# Patient Record
Sex: Female | Born: 1985
Health system: Southern US, Community
[De-identification: ages and names within clinical notes are randomized; demographics above are authoritative.]

## PROBLEM LIST (undated history)

## (undated) DIAGNOSIS — F419 Anxiety disorder, unspecified: Secondary | ICD-10-CM

## (undated) DIAGNOSIS — N809 Endometriosis, unspecified: Secondary | ICD-10-CM

## (undated) DIAGNOSIS — G43909 Migraine, unspecified, not intractable, without status migrainosus: Secondary | ICD-10-CM

## (undated) DIAGNOSIS — L52 Erythema nodosum: Secondary | ICD-10-CM

## (undated) DIAGNOSIS — E669 Obesity, unspecified: Secondary | ICD-10-CM

## (undated) DIAGNOSIS — Z872 Personal history of diseases of the skin and subcutaneous tissue: Secondary | ICD-10-CM

## (undated) DIAGNOSIS — L700 Acne vulgaris: Secondary | ICD-10-CM

## (undated) DIAGNOSIS — I10 Essential (primary) hypertension: Secondary | ICD-10-CM

## (undated) DIAGNOSIS — J309 Allergic rhinitis, unspecified: Secondary | ICD-10-CM

## (undated) DIAGNOSIS — Z8489 Family history of other specified conditions: Secondary | ICD-10-CM

## (undated) HISTORY — DX: Endometriosis, unspecified: N80.9

## (undated) HISTORY — DX: Anxiety disorder, unspecified: F41.9

## (undated) HISTORY — DX: Obesity, unspecified: E66.9

## (undated) HISTORY — DX: Erythema nodosum: L52

## (undated) HISTORY — DX: Acne vulgaris: L70.0

## (undated) HISTORY — DX: Migraine, unspecified, not intractable, without status migrainosus: G43.909

## (undated) HISTORY — PX: WISDOM TOOTH EXTRACTION: SHX21

## (undated) HISTORY — DX: Essential (primary) hypertension: I10

## (undated) HISTORY — DX: Personal history of diseases of the skin and subcutaneous tissue: Z87.2

## (undated) HISTORY — DX: Allergic rhinitis, unspecified: J30.9

---

## 2004-01-25 ENCOUNTER — Other Ambulatory Visit: Admission: RE | Admit: 2004-01-25 | Discharge: 2004-01-25 | Payer: Self-pay | Admitting: *Deleted

## 2005-03-18 ENCOUNTER — Other Ambulatory Visit: Admission: RE | Admit: 2005-03-18 | Discharge: 2005-03-18 | Payer: Self-pay | Admitting: *Deleted

## 2006-03-17 ENCOUNTER — Other Ambulatory Visit: Admission: RE | Admit: 2006-03-17 | Discharge: 2006-03-17 | Payer: Self-pay | Admitting: *Deleted

## 2007-05-26 ENCOUNTER — Other Ambulatory Visit: Admission: RE | Admit: 2007-05-26 | Discharge: 2007-05-26 | Payer: Self-pay | Admitting: *Deleted

## 2011-08-01 ENCOUNTER — Ambulatory Visit (INDEPENDENT_AMBULATORY_CARE_PROVIDER_SITE_OTHER): Payer: BC Managed Care – PPO | Admitting: Physician Assistant

## 2011-08-01 DIAGNOSIS — M545 Low back pain, unspecified: Secondary | ICD-10-CM

## 2011-08-13 ENCOUNTER — Ambulatory Visit (INDEPENDENT_AMBULATORY_CARE_PROVIDER_SITE_OTHER): Payer: BC Managed Care – PPO

## 2011-08-13 DIAGNOSIS — M545 Low back pain, unspecified: Secondary | ICD-10-CM

## 2011-08-13 DIAGNOSIS — L0501 Pilonidal cyst with abscess: Secondary | ICD-10-CM

## 2011-08-15 ENCOUNTER — Ambulatory Visit (INDEPENDENT_AMBULATORY_CARE_PROVIDER_SITE_OTHER): Payer: BC Managed Care – PPO | Admitting: Physician Assistant

## 2011-08-15 DIAGNOSIS — L0591 Pilonidal cyst without abscess: Secondary | ICD-10-CM

## 2011-08-17 ENCOUNTER — Ambulatory Visit (INDEPENDENT_AMBULATORY_CARE_PROVIDER_SITE_OTHER): Payer: BC Managed Care – PPO

## 2011-08-17 DIAGNOSIS — L0501 Pilonidal cyst with abscess: Secondary | ICD-10-CM

## 2011-08-19 ENCOUNTER — Encounter: Payer: Self-pay | Admitting: Physician Assistant

## 2011-08-19 ENCOUNTER — Ambulatory Visit (INDEPENDENT_AMBULATORY_CARE_PROVIDER_SITE_OTHER): Payer: BC Managed Care – PPO

## 2011-08-19 DIAGNOSIS — I10 Essential (primary) hypertension: Secondary | ICD-10-CM | POA: Insufficient documentation

## 2011-08-19 DIAGNOSIS — L0501 Pilonidal cyst with abscess: Secondary | ICD-10-CM

## 2011-08-21 ENCOUNTER — Ambulatory Visit (INDEPENDENT_AMBULATORY_CARE_PROVIDER_SITE_OTHER): Payer: BC Managed Care – PPO | Admitting: Physician Assistant

## 2011-08-21 VITALS — BP 130/90 | HR 84 | Temp 98.4°F | Resp 20 | Ht 69.0 in | Wt 259.0 lb

## 2011-08-21 DIAGNOSIS — L0591 Pilonidal cyst without abscess: Secondary | ICD-10-CM

## 2011-08-21 NOTE — Patient Instructions (Signed)
Continue doxycycline.  Continue warm compresses.

## 2011-08-21 NOTE — Progress Notes (Signed)
  Subjective:    Patient ID: Kimberly Glass, female    DOB: March 17, 1986, 26 y.o.   MRN: [id number]  HPI  26 yo WF presents for wound care after I&D of Infected pilonidal cyst on 08/13/2011.  Tolerating Doxycycline well.  Decreasing pain and drainage as expected.  Review of Systems Decreasing pain at site.      Objective:   Physical Exam  VS noted.  Dressing and packing removed.  Small amount of purulence expressed.  No induration.  No erythema.  Minimal tenderness.  Irrigated wound with 2 cc 2% lidocaine plain.  Re-packed with 1/4 inch plain packing and dressed.      Assessment & Plan:  Infected Pilonidal Cyst, s/p I&D 08/13/2011.  Complete Doxycycline.  Continue dressing changes daily and prn.  Return for wound care in 48 hours.

## 2011-08-23 ENCOUNTER — Ambulatory Visit (INDEPENDENT_AMBULATORY_CARE_PROVIDER_SITE_OTHER): Payer: BC Managed Care – PPO | Admitting: Physician Assistant

## 2011-08-23 VITALS — BP 116/76 | HR 102 | Temp 99.1°F | Resp 16 | Ht 69.0 in | Wt 262.0 lb

## 2011-08-23 DIAGNOSIS — L0591 Pilonidal cyst without abscess: Secondary | ICD-10-CM

## 2011-08-23 DIAGNOSIS — L0501 Pilonidal cyst with abscess: Secondary | ICD-10-CM

## 2011-08-23 NOTE — Progress Notes (Signed)
  Subjective:    Patient ID: Kimberly Glass, female    DOB: 1985/12/06, 26 y.o.   MRN: [id number]  HPI Patient presents for wound care status post I and D. Of an infected pilonidal cyst on 08/13/2011. She continues to do well and will take her last dose of doxycycline today. She notes itching at the area.   Review of Systems Itching. Appropriate drainage. Minimal discomfort.    Objective:   Physical Exam Dressing and packing removed. Appropriate bloody discharge. No pus. Nontender. Wound cavity irrigated with 2 cc of 2% plain lidocaine. Gently packed with 1/4 inch plain packing. Dressed.       Assessment & Plan:  Pilonidal cyst with abscess, status post I&D on 08/13/2011.  Continue wound care. Recheck with me in 48 hours.

## 2011-08-25 ENCOUNTER — Ambulatory Visit (INDEPENDENT_AMBULATORY_CARE_PROVIDER_SITE_OTHER): Payer: BC Managed Care – PPO | Admitting: Physician Assistant

## 2011-08-25 VITALS — BP 123/82 | HR 79 | Temp 98.9°F | Resp 16 | Ht 69.0 in | Wt 259.0 lb

## 2011-08-25 DIAGNOSIS — L0591 Pilonidal cyst without abscess: Secondary | ICD-10-CM

## 2011-08-25 NOTE — Progress Notes (Signed)
  Subjective:    Patient ID: Kimberly Glass, female    DOB: 14-Jul-1986, 26 y.o.   MRN: [id number]  Abscess   Patient presents for wound care status post I and D. Of an infected pilonidal cyst on 08/13/2011. She has completed doxycycline. She notes itching at the area.   Review of Systems  Itching. Appropriate drainage. Minimal discomfort.    Objective:   Physical Exam  Dressing and packing removed. Appropriate bloody discharge. No pus. Nontender. Wound cavity irrigated with 2 cc of 2% plain lidocaine. Gently packed with 1/4 inch plain packing. Dressed.       Assessment & Plan:  Pilonidal cyst with abscess, status post I&D on 08/13/2011.  Continue wound care. Recheck with Eula Listen, PA-C in 48 hours.

## 2011-08-27 ENCOUNTER — Encounter: Payer: Self-pay | Admitting: Physician Assistant

## 2011-08-27 ENCOUNTER — Ambulatory Visit (INDEPENDENT_AMBULATORY_CARE_PROVIDER_SITE_OTHER): Payer: BC Managed Care – PPO | Admitting: Physician Assistant

## 2011-08-27 VITALS — BP 129/80 | HR 88 | Temp 98.5°F | Resp 16 | Ht 69.0 in | Wt 262.4 lb

## 2011-08-27 DIAGNOSIS — L0501 Pilonidal cyst with abscess: Secondary | ICD-10-CM

## 2011-08-27 NOTE — Progress Notes (Signed)
   Patient ID: Kimberly Glass MRN: [id number], DOB: October 24, 1985 26 y.o. Date of Encounter: 08/27/2011, 6:49 PM  Primary Physician: No primary provider on file.  Chief Complaint: Wound care   See previous note  HPI: 26 y.o. y/o Caucasian female presents for wound care of pilonidal abscess s/p I&D on 08/15/2011.  Doing well No issues or complaints Afebrile/ no chills No nausea or vomiting Finished Doxycycline No pain Previous notes reviewed  Past Medical History  Diagnosis Date  . Obesity, unspecified   . Acne vulgaris   . Allergic rhinitis, cause unspecified   . Erythema nodosum     associated with menses; improved with OCP  . Essential hypertension, benign   . Anxiety      Home Meds: Prior to Admission medications   Medication Sig Start Date End Date Taking? Authorizing Provider  methyldopa (ALDOMET) 500 MG tablet Take 500 mg by mouth 2 (two) times daily.    Historical Provider, MD    Allergies:  Allergies  Allergen Reactions  . Diphenhydramine Other (See Comments)    Hyperactivity  . Yasmin (Drospirenone-Ethinyl Estradiol) Other (See Comments)    Migraine Headache  . Penicillins Hives    ROS: Constitutional: Afebrile, no chills Cardiovascular: negative for chest pain or palpitations Dermatological: Positive for wound. Negative for erythema, pain, or warmth  GI: No nausea or vomiting   EXAM: Physical Exam: Blood pressure 129/80, pulse 88, temperature 98.5 F (36.9 C), temperature source Oral, resp. rate 16, height 5\' 9"  (1.753 m), weight 262 lb 6.4 oz (119.024 kg), last menstrual period 08/07/2011. General: Well developed, well nourished, in no acute distress. Nontoxic appearing. Head: Normocephalic, atraumatic, sclera non-icteric.  Neck: Supple. Lungs: Breathing is unlabored. Heart: Normal rate Skin:  Warm and moist. Dressing and packing in place. No induration, erythema, tenderness to palpation. Neuro: Alert and oriented X 3. Moves all extremities  spontaneously. Normal gait.  Psych:  Responds to questions appropriately with a normal affect.   PROCEDURE: Dressing and packing removed. Small amount of bloody purulence expressed Wound bed healthy Irrigated with 1% plain lidocaine 5 cc. Repacked with 1/4 plain packing Dressing applied  LAB: Culture: No predominant organism  A/P: 26 y.o. y/o Caucasian female with pilonidal abscess as above s/p I&D on 08/15/11 -wound care per above -Finished Doxycycline -No pain -Daily dressing changes -Recheck 48 hours  Signed, Eula Listen, PA-C 08/27/2011

## 2011-08-27 NOTE — Patient Instructions (Signed)
Pilonidal Cyst A pilonidal cyst occurs when hairs get trapped (ingrown) beneath the skin in the crease between the buttocks over your sacrum (the bone under that crease). Pilonidal cysts are most common in young men with a lot of body hair. When the cyst is ruptured (breaks) or leaking, fluid from the cyst may cause burning and itching. If the cyst becomes infected, it causes a painful swelling filled with pus (abscess). The pus and trapped hairs need to be removed (often by lancing) so that the infection can heal. However, recurrence is common and an operation may be needed to remove the cyst. HOME CARE INSTRUCTIONS   If the cyst was NOT INFECTED:   Keep the area clean and dry. Bathe or shower daily. Wash the area well with a germ-killing soap. Warm tub baths may help prevent infection and help with drainage. Dry the area well with a towel.   Avoid tight clothing to keep area as moisture free as possible.   Keep area between buttocks as free of hair as possible. A depilatory may be used.   If the cyst WAS INFECTED and needed to be drained:   Your caregiver packed the wound with gauze to keep the wound open. This allows the wound to heal from the inside outwards and continue draining.   Return for a wound check in 1 day or as suggested.   If you take tub baths or showers, repack the wound with gauze following them. Sponge baths (at the sink) are a good alternative.   If an antibiotic was ordered to fight the infection, take as directed.   Only take over-the-counter or prescription medicines for pain, discomfort, or fever as directed by your caregiver.   After the drain is removed, use sitz baths for 20 minutes 4 times per day. Clean the wound gently with mild unscented soap, pat dry, and then apply a dry dressing.  SEEK MEDICAL CARE IF:   You have increased pain, swelling, redness, drainage, or bleeding from the area.   You have a fever.   You have muscles aches, dizziness, or a  general ill feeling.  Document Released: 07/05/2000 Document Revised: 03/20/2011 Document Reviewed: 09/02/2008 [employer] Patient Information 2012 [employer]. 

## 2011-08-29 ENCOUNTER — Ambulatory Visit (INDEPENDENT_AMBULATORY_CARE_PROVIDER_SITE_OTHER): Payer: BC Managed Care – PPO | Admitting: Physician Assistant

## 2011-08-29 ENCOUNTER — Encounter: Payer: Self-pay | Admitting: Physician Assistant

## 2011-08-29 VITALS — BP 131/85 | HR 90 | Temp 98.4°F | Resp 16 | Ht 69.0 in | Wt 261.0 lb

## 2011-08-29 DIAGNOSIS — L0501 Pilonidal cyst with abscess: Secondary | ICD-10-CM

## 2011-08-29 NOTE — Progress Notes (Signed)
   Patient ID: Kimberly Glass MRN: [id number], DOB: [birth year] 26 y.o. Date of Encounter: 08/29/2011, 1:38 PM  Primary Physician: No primary provider on file.  Chief Complaint: Wound care   See previous note  HPI: 26 y.o. y/o Caucasian female presents for wound care of pilonidal abscess s/p I&D on 08/15/11. Doing well No issues or complaints Afebrile/ no chills No nausea or vomiting Finished Doxycycline No pain Previous notes reviewed  Past Medical History  Diagnosis Date  . Obesity, unspecified   . Acne vulgaris   . Allergic rhinitis, cause unspecified   . Erythema nodosum     associated with menses; improved with OCP  . Essential hypertension, benign   . Anxiety      Home Meds: Prior to Admission medications   Medication Sig Start Date End Date Taking? Authorizing Provider  methyldopa (ALDOMET) 500 MG tablet Take 500 mg by mouth 2 (two) times daily.   Yes Historical Provider, MD    Allergies:  Allergies  Allergen Reactions  . Diphenhydramine Other (See Comments)    Hyperactivity  . Yasmin (Drospirenone-Ethinyl Estradiol) Other (See Comments)    Migraine Headache  . Penicillins Hives    ROS: Constitutional: Afebrile, no chills Cardiovascular: negative for chest pain or palpitations Dermatological: Positive for wound. Negative for erythema, pain, or warmth  GI: No nausea or vomiting   EXAM: Physical Exam: Blood pressure 131/85, pulse 90, temperature 98.4 F (36.9 C), temperature source Oral, resp. rate 16, height 5\' 9"  (1.753 m), weight 261 lb (118.389 kg), last menstrual period 08/07/2011. General: Well developed, well nourished, in no acute distress. Nontoxic appearing. Head: Normocephalic, atraumatic, sclera non-icteric.  Neck: Supple. Lungs: Breathing is unlabored. Heart: Normal rate Skin:  Warm and moist. Dressing and packing in place. No induration, erythema, tenderness to palpation. Neuro: Alert and oriented X 3. Moves all extremities  spontaneously. Normal gait.  Psych:  Responds to questions appropriately with a normal affect.   PROCEDURE: Dressing and packing removed. No purulence expressed Wound bed healthy Irrigated with 1% plain lidocaine 5 cc. Repacked with 1/4 plain packing Dressing applied  LAB: Culture: No predominant organism  A/P: 26 y.o. y/o Caucasian female with improving pilonidal abscess as above s/p I&D on 08/15/11 -wound care per above -Finished Doxycycline -No pain -Daily dressing changes -Recheck 48 hours  Elinor Dodge, PA-C 08/29/2011 1:43 PM

## 2011-08-31 ENCOUNTER — Ambulatory Visit (INDEPENDENT_AMBULATORY_CARE_PROVIDER_SITE_OTHER): Payer: BC Managed Care – PPO | Admitting: Physician Assistant

## 2011-08-31 VITALS — BP 124/86 | HR 82 | Temp 98.2°F | Resp 16 | Ht 69.0 in | Wt 261.0 lb

## 2011-08-31 DIAGNOSIS — L0501 Pilonidal cyst with abscess: Secondary | ICD-10-CM

## 2011-08-31 DIAGNOSIS — L0591 Pilonidal cyst without abscess: Secondary | ICD-10-CM

## 2011-08-31 NOTE — Progress Notes (Signed)
   Patient ID: Kimberly Glass MRN: [id number], DOB: 01-23-1986 26 y.o. Date of Encounter: 08/31/2011, 5:23 PM  Primary Physician: No primary provider on file.  Chief Complaint: Wound care   See previous note  HPI: 26 y.o. y/o Caucasian female presents for wound care of pilonidal abscess s/p I&D on 08/15/11. Doing well No issues or complaints Afebrile/ no chills No nausea or vomiting Finished Doxycycline No pain Previous note reviewed  O/w doing well with no issue or complaints.  Past Medical History  Diagnosis Date  . Obesity, unspecified   . Acne vulgaris   . Allergic rhinitis, cause unspecified   . Erythema nodosum     associated with menses; improved with OCP  . Essential hypertension, benign   . Anxiety      Home Meds: Prior to Admission medications   Medication Sig Start Date End Date Taking? Authorizing Provider  methyldopa (ALDOMET) 500 MG tablet Take 500 mg by mouth 2 (two) times daily.   Yes Historical Provider, MD    Allergies:  Allergies  Allergen Reactions  . Diphenhydramine Other (See Comments)    Hyperactivity  . Yasmin (Drospirenone-Ethinyl Estradiol) Other (See Comments)    Migraine Headache  . Penicillins Hives    ROS: Constitutional: Afebrile, no chills Cardiovascular: negative for chest pain or palpitations Dermatological: Positive for wound. Negative for erythema, pain, or warmth  GI: No nausea or vomiting   EXAM: Physical Exam: Blood pressure 124/86, pulse 82, temperature 98.2 F (36.8 C), temperature source Oral, resp. rate 16, height 5\' 9"  (1.753 m), weight 261 lb (118.389 kg), last menstrual period 08/07/2011. General: Well developed, well nourished, in no acute distress. Nontoxic appearing. Head: Normocephalic, atraumatic, sclera non-icteric.  Neck: Supple. Lungs: Breathing is unlabored. Heart: Normal rate. Skin:  Warm and moist. Dressing and packing in place. No induration, erythema, tenderness to palpation. Neuro: Alert and  oriented X 3. Moves all extremities spontaneously. Normal gait.  Psych:  Responds to questions appropriately with a normal affect.   PROCEDURE: Dressing and packing removed. No purulence expressed Wound bed healthy Irrigated with 1% plain lidocaine 5 cc. Repacked with 1/4 plain packing Dressing applied  LAB: Culture: No predominant organism  A/P: 26 y.o. y/o Caucasian female with improving pilonidal abscess as above s/p I&D on 08/15/11. -wound care per above -Finished Doxycycline -No pain -Daily dressing changes -Recheck 72 hours  Elinor Dodge, PA-C 08/31/2011 5:23 PM

## 2011-09-03 ENCOUNTER — Ambulatory Visit (INDEPENDENT_AMBULATORY_CARE_PROVIDER_SITE_OTHER): Payer: BC Managed Care – PPO | Admitting: Physician Assistant

## 2011-09-03 VITALS — BP 139/90 | HR 87 | Temp 98.8°F | Resp 16 | Ht 69.0 in | Wt 259.2 lb

## 2011-09-03 DIAGNOSIS — L0501 Pilonidal cyst with abscess: Secondary | ICD-10-CM

## 2011-09-03 MED ORDER — DOXYCYCLINE HYCLATE 50 MG PO CAPS: 100.0000 mg | ORAL_CAPSULE | Freq: Two times a day (BID) | ORAL | Status: AC

## 2011-09-03 NOTE — Progress Notes (Signed)
   Patient ID: Kimberly Glass MRN: [id number], DOB: Apr 25, 1986 26 y.o. Date of Encounter: 09/03/2011, 6:20 PM  Primary Physician: No primary provider on file.  Chief Complaint: Wound care   See previous note  HPI: 26 y.o. y/o Caucasian female presents for wound care s/p I&D on 08/15/11. Doing well No issues or complaints Afebrile/ no chills No nausea or vomiting Finished Doxycycline No pain Previous note reviewed  Past Medical History  Diagnosis Date  . Obesity, unspecified   . Acne vulgaris   . Allergic rhinitis, cause unspecified   . Erythema nodosum     associated with menses; improved with OCP  . Essential hypertension, benign   . Anxiety      Home Meds: Prior to Admission medications   Medication Sig Start Date End Date Taking? Authorizing Provider  methyldopa (ALDOMET) 500 MG tablet Take 500 mg by mouth 2 (two) times daily.   Yes Historical Provider, MD    Allergies:  Allergies  Allergen Reactions  . Diphenhydramine Other (See Comments)    Hyperactivity  . Yasmin (Drospirenone-Ethinyl Estradiol) Other (See Comments)    Migraine Headache  . Penicillins Hives    ROS: Constitutional: Afebrile, no chills Cardiovascular: negative for chest pain or palpitations Dermatological: Positive for wound. Negative for erythema, pain, or warmth  GI: No nausea or vomiting   EXAM: Physical Exam: Blood pressure 139/90, pulse 87, temperature 98.8 F (37.1 C), temperature source Oral, resp. rate 16, height 5\' 9"  (1.753 m), weight 259 lb 3.2 oz (117.572 kg), last menstrual period 08/07/2011., Body mass index is 38.28 kg/(m^2). General: Well developed, well nourished, in no acute distress. Nontoxic appearing. Head: Normocephalic, atraumatic, sclera non-icteric.  Neck: Supple. Lungs: Breathing is unlabored. Heart: Normal rate. Skin:  Warm and moist. Dressing and packing in place. No induration, erythema, tenderness to palpation. Does have an isolated pustule at  approximately 11 o'clock from the initial lesion. No induration or fluctuance.   Neuro: Alert and oriented X 3. Moves all extremities spontaneously. Normal gait.  Psych:  Responds to questions appropriately with a normal affect.   PROCEDURE: Dressing and packing removed. Scant purulence expressed Wound bed healthy Irrigated with 1% plain lidocaine 5 cc Repacked with 1/4 plain packing Dressing applied  LAB: Culture: No predominant organism  A/P: 26 y.o. y/o Caucasian female with improving cellulitis/abscess as above s/p I&D on 08/15/11. -wound care per above -Restart Doxycycline 100 mg #20 1 po bid no RF, secondary to new pustule  -Pain well controlled -Daily dressing changes -Advance packing in 48 hours -Recheck 48 hours later  Signed, Eula Listen, PA-C 09/03/2011 6:20 PM

## 2011-09-07 ENCOUNTER — Ambulatory Visit (INDEPENDENT_AMBULATORY_CARE_PROVIDER_SITE_OTHER): Payer: BC Managed Care – PPO | Admitting: Physician Assistant

## 2011-09-07 VITALS — BP 118/74 | HR 76 | Temp 98.6°F | Resp 16 | Ht 69.0 in | Wt 261.0 lb

## 2011-09-07 DIAGNOSIS — L0501 Pilonidal cyst with abscess: Secondary | ICD-10-CM

## 2011-09-07 NOTE — Progress Notes (Signed)
   Patient ID: Kimberly Glass MRN: [id number], DOB: Dec 02, 1985 26 y.o. Date of Encounter: 09/07/2011, 3:31 PM  Primary Physician: No primary provider on file.  Chief Complaint: Wound care   See previous note  HPI: 26 y.o. y/o female presents for wound care s/p I&D on 08/15/11. Doing well No issues or complaints Afebrile/ no chills No nausea or vomiting Tolerating Doxycycline No pain Previous note reviewed  Past Medical History  Diagnosis Date  . Obesity, unspecified   . Acne vulgaris   . Allergic rhinitis, cause unspecified   . Erythema nodosum     associated with menses; improved with OCP  . Essential hypertension, benign   . Anxiety      Home Meds: Prior to Admission medications   Medication Sig Start Date End Date Taking? Authorizing Provider  doxycycline (VIBRAMYCIN) 50 MG capsule Take 2 capsules (100 mg total) by mouth 2 (two) times daily. 09/03/11 09/13/11  Yakima Kreitzer, PA-C  methyldopa (ALDOMET) 500 MG tablet Take 500 mg by mouth 2 (two) times daily.    Historical Provider, MD    Allergies:  Allergies  Allergen Reactions  . Diphenhydramine Other (See Comments)    Hyperactivity  . Yasmin (Drospirenone-Ethinyl Estradiol) Other (See Comments)    Migraine Headache  . Penicillins Hives    ROS: Constitutional: Afebrile, no chills Cardiovascular: negative for chest pain or palpitations Dermatological: Positive for wound. Negative for erythema, pain, or warmth  GI: No nausea or vomiting   EXAM: Physical Exam: Blood pressure 118/74, pulse 76, temperature 98.6 F (37 C), temperature source Oral, resp. rate 16, height 5\' 9"  (1.753 m), weight 261 lb (118.389 kg), last menstrual period 08/07/2011., Body mass index is 38.54 kg/(m^2). General: Well developed, well nourished, in no acute distress. Nontoxic appearing. Head: Normocephalic, atraumatic, sclera non-icteric.  Neck: Supple. Lungs: Breathing is unlabored. Heart: Normal rate. Skin:  Warm and moist. Dressing  and packing in place. No induration, erythema, tenderness to palpation. Neuro: Alert and oriented X 3. Moves all extremities spontaneously. Normal gait.  Psych:  Responds to questions appropriately with a normal affect.   PROCEDURE: Dressing and packing removed. Scant purulence expressed Wound bed healthy Irrigated with 1% plain lidocaine 5 cc. Repacked with 1/4 inch plain packing Dressing applied  LAB: Culture: No predominant organism  A/P: 26 y.o. y/o female with improving cellulitis/abscess as above s/p I&D on 08/15/11. -wound care per above -continue Doxycycline -Pain well controlled -Daily dressing changes -Advance packing in 48 hours, follow up 48 hours after  Signed, Eula Listen, PA-C 09/07/2011 3:31 PM

## 2011-09-11 ENCOUNTER — Ambulatory Visit (INDEPENDENT_AMBULATORY_CARE_PROVIDER_SITE_OTHER): Payer: BC Managed Care – PPO | Admitting: Physician Assistant

## 2011-09-11 VITALS — BP 130/78 | HR 76 | Temp 98.6°F | Resp 16 | Ht 69.0 in | Wt 258.8 lb

## 2011-09-11 DIAGNOSIS — L0501 Pilonidal cyst with abscess: Secondary | ICD-10-CM

## 2011-09-11 NOTE — Progress Notes (Signed)
   Patient ID: SHUKRI NISTLER MRN: [id number], DOB: Aug 07, 1985 26 y.o. Date of Encounter: 09/11/2011, 1:06 PM  Primary Physician: No primary provider on file.  Chief Complaint: Wound care   See previous note  HPI: 26 y.o. y/o female presents for wound care s/p I&D on 08/15/11. Doing well No issues or complaints Afebrile/ no chills No nausea or vomiting Tolerating Doxycycline No pain Advanced packing a small amount 48 hours prior without difficulty Previous note reviewed  Past Medical History  Diagnosis Date  . Obesity, unspecified   . Acne vulgaris   . Allergic rhinitis, cause unspecified   . Erythema nodosum     associated with menses; improved with OCP  . Essential hypertension, benign   . Anxiety      Home Meds: Prior to Admission medications   Medication Sig Start Date End Date Taking? Authorizing Provider  doxycycline (VIBRAMYCIN) 50 MG capsule Take 2 capsules (100 mg total) by mouth 2 (two) times daily. 09/03/11 09/13/11  Malyna Budney, PA-C  methyldopa (ALDOMET) 500 MG tablet Take 500 mg by mouth 2 (two) times daily.    Historical Provider, MD    Allergies:  Allergies  Allergen Reactions  . Diphenhydramine Other (See Comments)    Hyperactivity  . Yasmin (Drospirenone-Ethinyl Estradiol) Other (See Comments)    Migraine Headache  . Penicillins Hives    ROS: Constitutional: Afebrile, no chills Cardiovascular: negative for chest pain or palpitations Dermatological: Positive for wound. Negative for erythema, pain, or warmth  GI: No nausea or vomiting   EXAM: Physical Exam: Blood pressure 130/78, pulse 76, temperature 98.6 F (37 C), temperature source Oral, resp. rate 16, height 5\' 9"  (1.753 m), weight 258 lb 12.8 oz (117.391 kg), last menstrual period 09/04/2011., Body mass index is 38.22 kg/(m^2). General: Well developed, well nourished, in no acute distress. Nontoxic appearing. Head: Normocephalic, atraumatic, sclera non-icteric.  Neck: Supple. Lungs:  Breathing is unlabored. Heart: Normal rate. Skin:  Warm and moist. Dressing and packing in place. No induration, erythema, or tenderness to palpation. Neuro: Alert and oriented X 3. Moves all extremities spontaneously. Normal gait.  Psych:  Responds to questions appropriately with a normal affect.   PROCEDURE: Dressing and packing removed. Scant purulence expressed Wound bed healthy Irrigated with 1% plain lidocaine 5 cc. Repacked with 1/4 inch plain packing Dressing applied  LAB: Culture: No predominant organism  A/P: 26 y.o. y/o female with improving cellulitis/abscess as above s/p I&D on 08/15/11 -wound care per above -continue Doxycycline -Pain well controlled -Daily dressing changes -Recheck 48 hours  Signed, Eula Listen, PA-C 09/11/2011 1:06 PM

## 2011-09-13 ENCOUNTER — Ambulatory Visit (INDEPENDENT_AMBULATORY_CARE_PROVIDER_SITE_OTHER): Payer: BC Managed Care – PPO | Admitting: Physician Assistant

## 2011-09-13 VITALS — BP 124/81 | HR 97 | Temp 98.6°F | Resp 16 | Ht 69.0 in | Wt 259.0 lb

## 2011-09-13 DIAGNOSIS — L0501 Pilonidal cyst with abscess: Secondary | ICD-10-CM

## 2011-09-13 NOTE — Progress Notes (Signed)
   Patient ID: Kimberly Glass MRN: [id number], DOB: 18-May-1986 26 y.o. Date of Encounter: 09/13/2011, 6:01 PM  Primary Physician: No primary provider on file.  Chief Complaint: Wound care   See previous note  HPI: 26 y.o. y/o female presents for wound care s/p I&D on 08/15/11. Doing well No issues or complaints Afebrile/ no chills No nausea or vomiting Finished second round of Doxycycline No pain Previous note reviewed  Past Medical History  Diagnosis Date  . Obesity, unspecified   . Acne vulgaris   . Allergic rhinitis, cause unspecified   . Erythema nodosum     associated with menses; improved with OCP  . Essential hypertension, benign   . Anxiety      Home Meds: Prior to Admission medications   Medication Sig Start Date End Date Taking? Authorizing Provider  doxycycline (VIBRAMYCIN) 50 MG capsule Take 2 capsules (100 mg total) by mouth 2 (two) times daily. 09/03/11 09/13/11  Diamon Reddinger, PA-C  methyldopa (ALDOMET) 500 MG tablet Take 500 mg by mouth 2 (two) times daily.    Historical Provider, MD    Allergies:  Allergies  Allergen Reactions  . Diphenhydramine Other (See Comments)    Hyperactivity  . Yasmin (Drospirenone-Ethinyl Estradiol) Other (See Comments)    Migraine Headache  . Penicillins Hives    ROS: Constitutional: Afebrile, no chills Cardiovascular: negative for chest pain or palpitations Dermatological: Positive for wound. Negative for erythema, pain, or warmth  GI: No nausea or vomiting   EXAM: Physical Exam: Blood pressure 124/81, pulse 97, temperature 98.6 F (37 C), temperature source Oral, resp. rate 16, height 5\' 9"  (1.753 m), weight 259 lb (117.482 kg), last menstrual period 09/04/2011., Body mass index is 38.25 kg/(m^2). General: Well developed, well nourished, in no acute distress. Nontoxic appearing. Head: Normocephalic, atraumatic, sclera non-icteric.  Neck: Supple. Lungs: Breathing is unlabored. Heart: Normal rate. Skin:  Warm and  moist. Dressing and packing in place. No induration, erythema, or tenderness to palpation. Neuro: Alert and oriented X 3. Moves all extremities spontaneously. Normal gait.  Psych:  Responds to questions appropriately with a normal affect.   PROCEDURE: Dressing and packing removed. No purulence expressed Wound bed healthy Irrigated with 1% plain lidocaine 5 cc. Repacked with 1/4 inch plain packing Dressing applied  LAB: Culture: No predominant organism  A/P: 26 y.o. y/o female with resolving cellulitis/abscess as above s/p I&D on 08/15/11. -wound care per above -Finished Doxycycline -Pain well controlled -Daily dressing changes -Recheck 96 hours, pull out 1/2 inch of packing in 48 hours then follow up 48 hours later  Signed, Eula Listen, PA-C 09/13/2011 6:01 PM

## 2011-09-17 ENCOUNTER — Ambulatory Visit (INDEPENDENT_AMBULATORY_CARE_PROVIDER_SITE_OTHER): Payer: BC Managed Care – PPO | Admitting: Physician Assistant

## 2011-09-17 VITALS — BP 118/80 | HR 76 | Temp 98.2°F | Resp 18 | Ht 69.0 in | Wt 258.0 lb

## 2011-09-17 DIAGNOSIS — L0501 Pilonidal cyst with abscess: Secondary | ICD-10-CM

## 2011-09-17 NOTE — Progress Notes (Signed)
   Patient ID: Kimberly Glass MRN: [id number], DOB: 1986-01-07 26 y.o. Date of Encounter: 09/17/2011, 6:01 PM  Primary Physician: No primary provider on file.  Chief Complaint: Wound care   See previous note  HPI: 26 y.o. y/o female presents for wound care s/p I&D on 08/15/11. Doing well No issues or complaints Afebrile/ no chills No nausea or vomiting Finished second round of Doxycycline No pain Previous note reviewed  Past Medical History  Diagnosis Date  . Obesity, unspecified   . Acne vulgaris   . Allergic rhinitis, cause unspecified   . Erythema nodosum     associated with menses; improved with OCP  . Essential hypertension, benign   . Anxiety      Home Meds: Prior to Admission medications   Medication Sig Start Date End Date Taking? Authorizing Provider  methyldopa (ALDOMET) 500 MG tablet Take 500 mg by mouth 2 (two) times daily.    Historical Provider, MD    Allergies:  Allergies  Allergen Reactions  . Diphenhydramine Other (See Comments)    Hyperactivity  . Yasmin (Drospirenone-Ethinyl Estradiol) Other (See Comments)    Migraine Headache  . Penicillins Hives    ROS: Constitutional: Afebrile, no chills Cardiovascular: negative for chest pain or palpitations Dermatological: Positive for wound. Negative for erythema, pain, or warmth  GI: No nausea or vomiting   EXAM: Physical Exam: Blood pressure 118/80, pulse 76, temperature 98.2 F (36.8 C), temperature source Oral, resp. rate 18, height 5\' 9"  (1.753 m), weight 258 lb (117.028 kg), last menstrual period 09/04/2011., Body mass index is 38.10 kg/(m^2). General: Well developed, well nourished, in no acute distress. Nontoxic appearing. Head: Normocephalic, atraumatic, sclera non-icteric.  Neck: Supple. Lungs: Breathing is unlabored. Heart: Normal rate. Skin:  Warm and moist. Dressing and packing in place. No induration, erythema, or tenderness to palpation. Neuro: Alert and oriented X 3. Moves all  extremities spontaneously. Normal gait.  Psych:  Responds to questions appropriately with a normal affect.   PROCEDURE: Dressing and packing removed. No purulence expressed Wound bed healthy Irrigated with 1% plain lidocaine 5 cc. Repacked with small amount of 1/4 inch plain packing superiorly  Dressing applied  LAB: Culture: No predominant organism  A/P: 26 y.o. y/o female with resolving cellulitis/abscess as above s/p I&D on 08/15/11 -wound care per above -Finished antibiotics -No pain -Daily dressing changes -Recheck 48 hours  Signed, Eula Listen, PA-C 09/17/2011 6:01 PM

## 2011-09-19 ENCOUNTER — Ambulatory Visit (INDEPENDENT_AMBULATORY_CARE_PROVIDER_SITE_OTHER): Payer: BC Managed Care – PPO | Admitting: Physician Assistant

## 2011-09-19 VITALS — BP 115/76 | HR 68 | Temp 98.4°F | Resp 16 | Ht 68.0 in | Wt 256.0 lb

## 2011-09-19 DIAGNOSIS — L0501 Pilonidal cyst with abscess: Secondary | ICD-10-CM

## 2011-09-19 DIAGNOSIS — I10 Essential (primary) hypertension: Secondary | ICD-10-CM

## 2011-09-19 MED ORDER — METHYLDOPA 500 MG PO TABS: 500.0000 mg | ORAL_TABLET | Freq: Two times a day (BID) | ORAL | Status: DC

## 2011-09-19 NOTE — Progress Notes (Signed)
  Subjective:    Patient ID: Kimberly Glass, female    DOB: 02-29-1986, 26 y.o.   MRN: [id number]  HPI Kimberly Glass presents for wound care, s/p I&D of a pilonidal cyst with abscess 08/13/2011.  She had great initial improvement, but then drainage increased and tenderness recurred.  She has now completed a second round of Doxycycline.  Minimal discomfort.  No fever/chills.  Additionally, she needs a refill of her BP med.  Tolerates Methyldopa well.  Had maintained good BP control on her current dose.  Allergies, medications reviewed.  Review of Systems No chest pain, SOB, HA, dizziness, vision change, N/V, diarrhea, dysuria, myalgias, arthralgias or rash.     Objective:   Physical Exam Vital signs noted. Well-developed, well nourished WF who is awake, alert and oriented, in NAD. Skin: warm and dry without rash. Surgical wound is uncovered and packing removed.  Minimal thick serosanguinous drainage expressed.  Irrigated wound bed with 3 cc 1% lidocaine plain.  Packed with 1/2 inch plain packing and dressed.     Assessment & Plan:  1. Pilonidal cyst, with abscess, s/p I&D 08/13/2011 Continue local wound care.  Re-check with Ms. Warrick, PA-C in 48 hours.  2. HTN, well-controlled Continue Methyldopa 500 mg BID. Recheck 6 months.

## 2011-09-19 NOTE — Patient Instructions (Signed)
Continue local wound care.  Return to see Kennedy Bucker, PA-C on Saturday 09/21/2011.

## 2011-09-21 ENCOUNTER — Ambulatory Visit (INDEPENDENT_AMBULATORY_CARE_PROVIDER_SITE_OTHER): Payer: BC Managed Care – PPO | Admitting: Physician Assistant

## 2011-09-21 VITALS — BP 131/81 | HR 75 | Temp 98.4°F | Resp 16 | Ht 68.0 in | Wt 259.0 lb

## 2011-09-21 DIAGNOSIS — L0591 Pilonidal cyst without abscess: Secondary | ICD-10-CM

## 2011-09-21 DIAGNOSIS — L0592 Pilonidal sinus without abscess: Secondary | ICD-10-CM

## 2011-09-21 NOTE — Progress Notes (Signed)
  Subjective:    Patient ID: Kimberly Glass, female    DOB: 13-Jun-1986, 26 y.o.   MRN: [id number]  HPI  Here for recheck pilonidal abscess.  Has finished antibiotics. Doing great.  No pain.  No drainage.   Review of Systems  All other systems reviewed and are negative.       Objective:   Physical Exam  Nursing note and vitals reviewed. Skin:       Gluteal cleft, packing removed.  No active drainage.  No erythema or induration. Healing well.          Assessment & Plan:  Allow to heal by secondary intention.  No need to repack.  Wound care.  Recheck prn.

## 2012-02-12 ENCOUNTER — Telehealth: Payer: Self-pay

## 2012-02-12 NOTE — Telephone Encounter (Signed)
PT IS WANTING TO KNOW IF WE CAN CALL DOXICYCLINE FOR A CYST   BEST NUMBER [phone]

## 2012-02-13 NOTE — Telephone Encounter (Signed)
OK to send in Doxycycline 100 mg 1 PO BID x 10 days, #20, no refills (if she's already left, OK to send it to a pharmacy where she is).  Advise her to apply warm compresses to the area x 20 minutes 3 x daily.

## 2012-02-13 NOTE — Telephone Encounter (Signed)
Spoke with pt and advised she needed to come in and be seen in regards to the cyst to evaluate and see if needing I & D.  Pt stated she would not be coming in because she is leaving for the beach tomorrow. She says it's nowhere near what it was when she saw Chelle in Feb, I still encouraged pt to come in today to be seen, but pt refused and wanted to see if Chelle would rx Doxy to her.

## 2012-02-14 MED ORDER — DOXYCYCLINE HYCLATE 100 MG PO TABS: 100.0000 mg | ORAL_TABLET | Freq: Two times a day (BID) | ORAL | Status: AC

## 2012-02-14 NOTE — Telephone Encounter (Signed)
Spoke with pt, verified pharmacy. Rx sent in

## 2012-07-30 ENCOUNTER — Encounter: Payer: Self-pay | Admitting: Obstetrics and Gynecology

## 2012-07-30 ENCOUNTER — Ambulatory Visit (INDEPENDENT_AMBULATORY_CARE_PROVIDER_SITE_OTHER): Payer: BC Managed Care – PPO

## 2012-07-30 ENCOUNTER — Other Ambulatory Visit: Payer: Self-pay | Admitting: Obstetrics and Gynecology

## 2012-07-30 ENCOUNTER — Ambulatory Visit (INDEPENDENT_AMBULATORY_CARE_PROVIDER_SITE_OTHER): Payer: BC Managed Care – PPO | Admitting: Obstetrics and Gynecology

## 2012-07-30 VITALS — BP 120/84 | Temp 99.0°F | Ht 69.0 in | Wt 281.0 lb

## 2012-07-30 DIAGNOSIS — N9089 Other specified noninflammatory disorders of vulva and perineum: Secondary | ICD-10-CM | POA: Insufficient documentation

## 2012-07-30 DIAGNOSIS — N946 Dysmenorrhea, unspecified: Secondary | ICD-10-CM

## 2012-07-30 DIAGNOSIS — N926 Irregular menstruation, unspecified: Secondary | ICD-10-CM | POA: Insufficient documentation

## 2012-07-30 DIAGNOSIS — N949 Unspecified condition associated with female genital organs and menstrual cycle: Secondary | ICD-10-CM

## 2012-07-30 DIAGNOSIS — R102 Pelvic and perineal pain: Secondary | ICD-10-CM

## 2012-07-30 MED ORDER — NORETHINDRONE ACETATE 5 MG PO TABS: ORAL_TABLET | ORAL | Status: DC

## 2012-07-30 NOTE — Progress Notes (Signed)
NEW PT GYN PROBLEM VISIT   [name] is a 27 y.o. year old female,[id number], who presents for a problem visit.   Subjective: 1)The patient presents for evaluation of left lower quadrant pain, which precedes her period for as much as a week and lasts through the cycle. She may also have a sharp, shooting pain in the same location. During midcycle, which she thinks may be ovulation. This has been present for approximately 3 months. She has always had painful menses with cramps, but this pain that precedes her menses is new in terms of its localization. In the left lower quadrant and in its starting one week prior to menses.  The patient's mother and grandmother both have had endometriosis. It is rated at 7/10 at its worst. It is not associated with nausea, vomiting, fever, chills, or urinary symptoms. The patient has no history of kidney stones and has had no imaging to evaluate the pain. He does get some relief from Advil. She is not currently sexually active and therefore, desires no contraception. 2) the patient also presents for evaluation of a labial ," sore." She first noticed a similar outbreak in August of 2012 and was evaluated. By the time she was seen at the doctor's office. The lesion had resolved and HIV testing was done, but no other blood work. She has had the second outbreak that occurred just recently and is quite painful. She has had yeast infections and bacterial infections in the past, but denies gonorrhea, chlamydia, syphilis, herpes, or genital warts.  Objective:  BP 120/84  Temp 99 F (37.2 C) (Oral)  Ht 5\' 9"  (1.753 m)  Wt 281 lb (127.461 kg)  BMI 41.50 kg/m2  LMP 07/25/2012   General: alert, cooperative, appears stated age, no distress and morbidly obese GI: soft, non-tender; bowel sounds normal; no masses,  no organomegaly Vaginal Bleeding: minimal  External genitalia: Ulcerated pinpoint lesion on the left vulva at approximately the 5:00 position appear the  area is quite tender to touch. Vaginal: normal rugae. Small amount of blood in the vault Cervix: normal appearance Adnexa: No palpable masses, however, there is moderate tenderness in the left adnexum. Uterus: Does not feel enlarged and is mobile Rectal: no masses and No uterosacral nodularity appreciated Exam limited by body habitus   ULTRASOUND: Uterus: Length: 5.49 cm   Width:  4.08 cm   Height:  3.64 cm  Endo thickness:  0.418 cm   Left ovary:Normal Right ovary:Normal Fibroids:no   CDS fluid:no  Comment: Anteverted uterus. Normal in appearance. Normal appearing, trilayered endometrium. Normal adnexa.    Assessment: New-onset premenstrual left lower quadrant pain. Cannot completely rule out endometriosis in this patient with a family history. Left labial lesion. Rule out herpes episode Chronic hypertension Morbid obesity  Plan: Patient was offered laparoscopy as the next step in diagnosing endometriosis and working up pelvic pain.  She currently declines The next option offered was progesterone therapy in the form of oral progesterone or a progesterone-containing IUD as well as progesterone implant.  The patient opts for oral progesterone and is prescribed norethindrone 15 mg a day Return to office in 6 week(s).   Dierdre Forth, MD  07/30/2012 12:06 PM

## 2012-08-05 ENCOUNTER — Other Ambulatory Visit: Payer: Self-pay | Admitting: Physician Assistant

## 2013-01-14 ENCOUNTER — Ambulatory Visit (INDEPENDENT_AMBULATORY_CARE_PROVIDER_SITE_OTHER): Payer: BC Managed Care – PPO | Admitting: Physician Assistant

## 2013-01-14 ENCOUNTER — Encounter: Payer: Self-pay | Admitting: Physician Assistant

## 2013-01-14 VITALS — BP 136/94 | HR 82 | Temp 98.1°F | Resp 18 | Ht 69.0 in | Wt 279.0 lb

## 2013-01-14 DIAGNOSIS — L0211 Cutaneous abscess of neck: Secondary | ICD-10-CM

## 2013-01-14 DIAGNOSIS — L0591 Pilonidal cyst without abscess: Secondary | ICD-10-CM

## 2013-01-14 DIAGNOSIS — N809 Endometriosis, unspecified: Secondary | ICD-10-CM

## 2013-01-14 DIAGNOSIS — I10 Essential (primary) hypertension: Secondary | ICD-10-CM

## 2013-01-14 DIAGNOSIS — Z0000 Encounter for general adult medical examination without abnormal findings: Secondary | ICD-10-CM

## 2013-01-14 DIAGNOSIS — R5381 Other malaise: Secondary | ICD-10-CM

## 2013-01-14 DIAGNOSIS — R5383 Other fatigue: Secondary | ICD-10-CM

## 2013-01-14 LAB — TSH: TSH: 1.658 u[IU]/mL (ref 0.350–4.500)

## 2013-01-14 LAB — POCT UA - MICROSCOPIC ONLY
Casts, Ur, LPF, POC: NEGATIVE
Yeast, UA: NEGATIVE

## 2013-01-14 LAB — COMPREHENSIVE METABOLIC PANEL
ALT: 64 U/L — ABNORMAL HIGH (ref 0–35)
AST: 21 U/L (ref 0–37)
CO2: 22 mEq/L (ref 19–32)
Chloride: 107 mEq/L (ref 96–112)
Sodium: 139 mEq/L (ref 135–145)
Total Bilirubin: 0.7 mg/dL (ref 0.3–1.2)
Total Protein: 6.7 g/dL (ref 6.0–8.3)

## 2013-01-14 LAB — POCT URINALYSIS DIPSTICK
Glucose, UA: NEGATIVE
Nitrite, UA: NEGATIVE
Urobilinogen, UA: 0.2

## 2013-01-14 LAB — LIPID PANEL
Cholesterol: 205 mg/dL — ABNORMAL HIGH (ref 0–200)
VLDL: 23 mg/dL (ref 0–40)

## 2013-01-14 MED ORDER — METHYLDOPA 500 MG PO TABS: 500.0000 mg | ORAL_TABLET | Freq: Two times a day (BID) | ORAL | Status: DC

## 2013-01-14 NOTE — Progress Notes (Signed)
  Subjective:    Patient ID: Kimberly Glass, female    DOB: 15-Apr-1986, 27 y.o.   MRN: [id number]  HPI  Presents for annual physical. States she has been diagnosed with endometriosis within the past year & current treatment is managed by Dr. Pennie Rushing. Would like referral for recurring pilonidal cyst to general surgeon. Needs refill on BP medication, states she has not been taking it as she was directed. Noticed her BP higher at home than usual. Additionally, has felt tired since she was diagnosed with endometriosis and wonders if it is the medication. Does not affect her work but at the end of the day states she is very tired.   Review of Systems Denies: headache, vision changes, SOB, chest pain, abdominal pain, urinary symptoms.     Objective:   Physical Exam  BP 136/94  Pulse 82  Temp(Src) 98.1 F (36.7 C) (Oral)  Resp 18  Ht 5\' 9"  (1.753 m)  Wt 279 lb (126.554 kg)  BMI 41.18 kg/m2  SpO2 97%  General: WDWN female, appears stated age, NAD Head: normocephalic atraumatic Eyes: PERLA, sclera/conjunctiva clear Ears: TMs clear with visible bony landmarks and light reflex Nose: turbinates normal, no visible discharge Throat: uvula midline, moist mucous membranes, posterior pharynx without edema erythema or exudate  Neck: supple, no palpable lymphadenopathy Resp: clear to auscultation anterior & posterior fields bilaterally, without rales rhonchi wheezes Cardiac: RRR, no murmurs rubs gallops Abdomen: normal bowel sounds, soft, non distended, non tender to palpation, no rebound, no guarding  Extremities: moves all limbs spontaneously, full ROM, strength 5/5 UE & LE, radial and dorsalis pedis pulses intact and even bilaterally, reflexes 2+ biceps brachioradialis patellar & achilles Neuro: A&O x 3, cranial nerves II-XII grossly intact Skin: no rashes lesions      Assessment & Plan:  Essential hypertension, benign - Plan: POCT UA - Microscopic Only, POCT urinalysis dipstick, CBC with  Differential, Comprehensive metabolic panel, Lipid panel, TSH, methyldopa (ALDOMET) 500 MG tablet  Endometriosis  Fatigue - Plan: CBC with Differential, TSH  Pilonidal cyst - Plan: Ambulatory referral to General Surgery  Suspect increase in BP is from lack of medication.  Suspect fatigue is related to increase in BP.  RTC in 1 year or sooner if symptoms persist.

## 2013-01-14 NOTE — Progress Notes (Signed)
I have examined this patient along with the student and agree.  The last visit here for the pilonidal cyst was in 09/2011, but when she gets hot and sweaty, like when trying to exercise, it gets irritated and painful.  As such, she's avoiding exercise and other activities that she enjoys.  She's ready for definitive treatment.  Continue treatment for endometriosis per Dr. Pennie Rushing.

## 2013-01-14 NOTE — Patient Instructions (Addendum)
I will contact you with your lab results as soon as they are available.   If you have not heard from me in 2 weeks, please contact me.  The fastest way to get your results is to register for My Chart (see the instructions on the last page of this printout).  Keeping You Healthy  Get These Tests 1. Blood Pressure- Have your blood pressure checked once a year by your health care provider.  Normal blood pressure is 120/80. 2. Weight- Have your body mass index (BMI) calculated to screen for obesity.  BMI is measure of body fat based on height and weight.  You can also calculate your own BMI at www.nhlbisupport.com/bmi/. 3. Cholesterol- Have your cholesterol checked every 5 years starting at age 20 then yearly starting at age 45. 4. Chlamydia, HIV, and other sexually transmitted diseases- Get screened every year until age 25, then within three months of each new sexual provider. 5. Pap Smear- Every 1-3 years; discuss with your health care provider. 6. Mammogram- Every year starting at age 40  Take these medicines  Calcium with Vitamin D-Your body needs 1200 mg of Calcium each day and 800-1000 IU of Vitamin D daily.  Your body can only absorb 500 mg of Calcium at a time so Calcium must be taken in 2 or 3 divided doses throughout the day.  Multivitamin with folic acid- Once daily if it is possible for you to become pregnant.  Get these Immunizations  Gardasil-Series of three doses; prevents HPV related illness such as genital warts and cervical cancer.  Menactra-Single dose; prevents meningitis.  Tetanus shot- Every 10 years.  Flu shot-Every year.  Take these steps 1. Do not smoke-Your healthcare provider can help you quit.  For tips on how to quit go to www.smokefree.gov or call 1-800 QUITNOW. 2. Be physically active- Exercise 5 days a week for at least 30 minutes.  If you are not already physically active, start slow and gradually work up to 30 minutes of moderate physical activity.   Examples of moderate activity include walking briskly, dancing, swimming, bicycling, etc. 3. Breast Cancer- A self breast exam every month is important for early detection of breast cancer.  For more information and instruction on self breast exams, ask your healthcare provider or www.womenshealth.gov/faq/breast-self-exam.cfm. 4. Eat a healthy diet- Eat a variety of healthy foods such as fruits, vegetables, whole grains, low fat milk, low fat cheeses, yogurt, lean meats, poultry and fish, beans, nuts, tofu, etc.  For more information go to www. Thenutritionsource.org 5. Drink alcohol in moderation- Limit alcohol intake to one drink or less per day. Never drink and drive. 6. Depression- Your emotional health is as important as your physical health.  If you're feeling down or losing interest in things you normally enjoy please talk to your healthcare provider about being screened for depression. 7. Dental visit- Brush and floss your teeth twice daily; visit your dentist twice a year. 8. Eye doctor- Get an eye exam at least every 2 years. 9. Helmet use- Always wear a helmet when riding a bicycle, motorcycle, rollerblading or skateboarding. 10. Safe sex- If you may be exposed to sexually transmitted infections, use a condom. 11. Seat belts- Seat belts can save your live; always wear one. 12. Smoke/Carbon Monoxide detectors- These detectors need to be installed on the appropriate level of your home. Replace batteries at least once a year. 13. Skin cancer- When out in the sun please cover up and use sunscreen 15 SPF or higher.   14. Violence- If anyone is threatening or hurting you, please tell your healthcare provider.        

## 2013-01-15 LAB — CBC WITH DIFFERENTIAL/PLATELET
Basophils Absolute: 0.1 10*3/uL (ref 0.0–0.1)
Eosinophils Absolute: 0.1 10*3/uL (ref 0.0–0.7)
Lymphocytes Relative: 30 % (ref 12–46)
Lymphs Abs: 1.6 10*3/uL (ref 0.7–4.0)
Neutrophils Relative %: 59 % (ref 43–77)
Platelets: 444 10*3/uL — ABNORMAL HIGH (ref 150–400)
RBC: 4.66 MIL/uL (ref 3.87–5.11)
WBC: 5.3 10*3/uL (ref 4.0–10.5)

## 2013-01-19 ENCOUNTER — Other Ambulatory Visit: Payer: Self-pay | Admitting: Physician Assistant

## 2013-01-26 ENCOUNTER — Ambulatory Visit (INDEPENDENT_AMBULATORY_CARE_PROVIDER_SITE_OTHER): Payer: BC Managed Care – PPO | Admitting: Surgery

## 2013-05-10 ENCOUNTER — Ambulatory Visit: Payer: BC Managed Care – PPO | Admitting: Physician Assistant

## 2013-05-10 VITALS — BP 140/82 | HR 108 | Temp 98.6°F | Resp 18 | Ht 69.0 in | Wt 271.0 lb

## 2013-05-10 DIAGNOSIS — G43909 Migraine, unspecified, not intractable, without status migrainosus: Secondary | ICD-10-CM

## 2013-05-10 DIAGNOSIS — I10 Essential (primary) hypertension: Secondary | ICD-10-CM

## 2013-05-10 MED ORDER — TOPIRAMATE 50 MG PO TABS: 50.0000 mg | ORAL_TABLET | Freq: Two times a day (BID) | ORAL | Status: DC

## 2013-05-10 MED ORDER — PROMETHAZINE HCL 25 MG PO TABS: 25.0000 mg | ORAL_TABLET | Freq: Four times a day (QID) | ORAL | Status: DC | PRN

## 2013-05-10 MED ORDER — BUTALBITAL-APAP-CAFFEINE 50-325-40 MG PO TABS: 1.0000 | ORAL_TABLET | Freq: Two times a day (BID) | ORAL | Status: DC | PRN

## 2013-05-10 MED ORDER — LISINOPRIL 5 MG PO TABS: 5.0000 mg | ORAL_TABLET | Freq: Every day | ORAL | Status: DC

## 2013-05-10 NOTE — Progress Notes (Signed)
  Subjective:    Patient ID: Kimberly Glass, female    DOB: 1986/02/14, 27 y.o.   MRN: [id number]  HPI  This 27 y.o. female presents for evaluation of headache.  She has a history of migraine, but has had them only 1-2 times annually until recently.  She's having 4 migraine HA every 2 months, and notes "regular" headaches daily.  She believes that they are triggered by stress.  She's sleeping well, and feels happy, but has a stressful job and feels nausea regularly.  As such, she has lost 8 pounds in the past 4 months.  This HA began Wednesday, as her typical HA, but didn't resolve with ibuprofen or with Excedrin Migraine. Tension sensation in the LEFT temple/parietal area, though her typical HA is in the posterior head. Nausea Photophobia and phonophobia at it's worst.  Also notes that the methydopa she uses for HTN is making her sleepy.  She's stopped taking it x 2 weeks.   Review of Systems As about.    Objective:   Physical Exam Blood pressure 140/82, pulse 108, temperature 98.6 F (37 C), temperature source Oral, resp. rate 18, height 5\' 9"  (1.753 m), weight 271 lb (122.925 kg), SpO2 100.00%. Body mass index is 40 kg/(m^2). Well-developed, well nourished WF who is awake, alert and oriented, in NAD. HEENT: [place]/AT, PERRL, EOMI.  Sclera and conjunctiva are clear.  EAC are patent, TMs are normal in appearance. Nasal mucosa is pink and moist. OP is clear. Neck: supple, non-tender, no lymphadenopathy, thyromegaly. Heart: RRR, no murmur Lungs: normal effort, CTA Neurologic:  CN II-XII grossly intact.  Sensation and strength intact.  DTR's intact. Extremities: no cyanosis, clubbing or edema. Skin: warm and dry without rash. Psychologic: good mood and appropriate affect, normal speech and behavior.        Assessment & Plan:  Migraine - also likely daily HA vs. Rebound HA. Plan: topiramate (TOPAMAX) 50 MG tablet, butalbital-acetaminophen-caffeine (FIORICET, ESGIC) 50-325-40 MG per  tablet, promethazine (PHENERGAN) 25 MG tablet  Essential hypertension, benign - D/C methyldopa due to adverse effects. Plan: lisinopril (PRINIVIL,ZESTRIL) 5 MG tablet.  Counseled against pregnancy while taking this medication.  RTC 6 weeks for re-evaluation.  Fernande Bras, PA-C Certified Physician Assistant [facility]

## 2013-05-10 NOTE — Patient Instructions (Signed)
Start the topiramate by taking 1/2 tablet at bedtime.  After 1 week, increase it to 1/2 tablet twice daily.  After 1 week, increase to 1/2 tablet in the morning and 1 whole tablet at bedtime.  After 1 week, increase to 1 whole tablet twice each day.

## 2013-05-11 NOTE — Progress Notes (Signed)
Patient made appointment for 12/2 for a recheck with Chelle.

## 2013-05-13 ENCOUNTER — Ambulatory Visit: Payer: BC Managed Care – PPO | Admitting: Physician Assistant

## 2013-05-13 VITALS — BP 132/78 | HR 100 | Temp 98.3°F | Resp 18 | Ht 69.0 in | Wt 269.0 lb

## 2013-05-13 DIAGNOSIS — R209 Unspecified disturbances of skin sensation: Secondary | ICD-10-CM

## 2013-05-13 DIAGNOSIS — R202 Paresthesia of skin: Secondary | ICD-10-CM

## 2013-05-13 MED ORDER — CYCLOBENZAPRINE HCL 10 MG PO TABS: 10.0000 mg | ORAL_TABLET | Freq: Three times a day (TID) | ORAL | Status: DC | PRN

## 2013-05-13 NOTE — Progress Notes (Signed)
  Subjective:    Patient ID: Kimberly Glass, female    DOB: Dec 28, 1985, 27 y.o.   MRN: [id number]  HPI Kimberly Glass is a 27 YO female who presents today with numbness and tingling on the left side of her head.  She was recently seen this past Monday for migraines and placed on Topiramate, Phenergan, and Fioricet.  She states that the headaches have improved but now she is left with a "tugging" sensation and tingling above her left ear and near her left temple that occurs constantly throughout the day which she describes as very distracting but not painful.  When she touches the area she can feel her own touch but the "tugging" sensation continues.  She states that the Fioricet did not help her headaches so she has stopped taking this.  The patient states that her Phenergan has helped dramatically for her nausea and states that at times it helps with her "tugging" sensation.  She continues to have fatigue throughout the day.    Review of Systems  Constitutional: Positive for fatigue. Negative for fever and chills.  Neurological: Negative for dizziness, weakness, light-headedness and headaches.       Objective:   Physical Exam  Kimberly Glass is a pleasant well-developed, well-nourished female in no acute distress Heart: normal rate and rhythm, no murmurs, gallops, or rubs Lung: Clear to auscultation bilaterally, no wheezing, rhonchi, or rales Neuro: CN II-XII grossly intact, sensation to parietal and temporal areas equal bilaterally M/S: Cervical ROM full, no pain elicited during ROM  BP 132/78  Pulse 100  Temp(Src) 98.3 F (36.8 C) (Oral)  Resp 18  Ht 5\' 9"  (1.753 m)  Wt 269 lb (122.018 kg)  BMI 39.71 kg/m2  SpO2 100%     Assessment & Plan:  Paresthesia - Plan: cyclobenzaprine (FLEXERIL) 10 MG tablet   Patient Instructions  Increase your Topiramate dose every 3 days instead of every week.   Discontinue using your Fioricet medication due to inefectiveness Continue using Phenergan as  needed Try the muscle relaxer to see if that helps and use your massage packages liberally

## 2013-05-13 NOTE — Patient Instructions (Signed)
Increase your Topiramate dose every 3 days instead of every week.   Discontinue using your Fioricet medication due to inefectiveness Continue using Phenergan as needed Try the muscle relaxer to see if that helps and use your massage packages liberally

## 2013-05-13 NOTE — Progress Notes (Signed)
I have examined this patient along with the student and agree.  

## 2013-05-19 ENCOUNTER — Ambulatory Visit: Payer: BC Managed Care – PPO | Admitting: Physician Assistant

## 2013-05-19 VITALS — BP 124/76 | HR 106 | Temp 98.8°F | Resp 18 | Wt 265.0 lb

## 2013-05-19 DIAGNOSIS — R42 Dizziness and giddiness: Secondary | ICD-10-CM

## 2013-05-19 DIAGNOSIS — R450 Nervousness: Secondary | ICD-10-CM

## 2013-05-19 DIAGNOSIS — F411 Generalized anxiety disorder: Secondary | ICD-10-CM

## 2013-05-19 DIAGNOSIS — R110 Nausea: Secondary | ICD-10-CM

## 2013-05-19 DIAGNOSIS — R319 Hematuria, unspecified: Secondary | ICD-10-CM

## 2013-05-19 DIAGNOSIS — R000 Tachycardia, unspecified: Secondary | ICD-10-CM

## 2013-05-19 DIAGNOSIS — F419 Anxiety disorder, unspecified: Secondary | ICD-10-CM

## 2013-05-19 LAB — POCT CBC
Granulocyte percent: 74.1 %G (ref 37–80)
MID (cbc): 0.5 (ref 0–0.9)
MPV: 8.6 fL (ref 0–99.8)
POC Granulocyte: 6.5 (ref 2–6.9)
POC MID %: 5.4 %M (ref 0–12)
Platelet Count, POC: 515 10*3/uL — AB (ref 142–424)
RBC: 5.11 M/uL (ref 4.04–5.48)

## 2013-05-19 LAB — POCT UA - MICROSCOPIC ONLY: Crystals, Ur, HPF, POC: NEGATIVE

## 2013-05-19 LAB — POCT URINALYSIS DIPSTICK
Bilirubin, UA: NEGATIVE
Ketones, UA: NEGATIVE
Leukocytes, UA: NEGATIVE
Protein, UA: NEGATIVE

## 2013-05-19 MED ORDER — VENLAFAXINE HCL 37.5 MG PO TABS: ORAL_TABLET | ORAL | Status: DC

## 2013-05-19 MED ORDER — ALPRAZOLAM 0.5 MG PO TABS: 0.2500 mg | ORAL_TABLET | Freq: Three times a day (TID) | ORAL | Status: DC | PRN

## 2013-05-19 NOTE — Patient Instructions (Signed)
Continue the current medications. In one week, start the Effexor, but let me know if you have any new or changing symptoms in the meantime. Let me know how you're feeling in 2-3 weeks (if you're feeling good, you can call or send me a message in My Chart).  I will contact you with your lab results as soon as they are available.   If you have not heard from me in 2 weeks, please contact me.  The fastest way to get your results is to register for My Chart (see the instructions on the last page of this printout).

## 2013-05-19 NOTE — Progress Notes (Signed)
I have examined this patient along with the student and agree.  Etiology of her symptoms are not clear, and may be attributed to the increased dose of topiramate.  It's possible it is exacerbated by the change in her BP meds, recent D/C of hormone treatment of endometriosis, and anxiety. Initially, she stated that her stress was manageable-that she felt good in that regard.  But then as we discussed this topic further, she reported increasing anxiety symptoms and the desire to take something daily, rather than just have something for acute symptoms.  She's done well on Effexor XR previously, and agrees to restart it.  She'll stay on the topiramate at the current dose for now, and discuss treatment options for endometriosis again with her GYN in 2 weeks when she's there for a previously scheduled appointment.  Follow up with me in 3-4 weeks, sooner if needed.

## 2013-05-19 NOTE — Progress Notes (Signed)
Subjective:    Patient ID: Kimberly Glass, female    DOB: 1986/07/12, 27 y.o.   MRN: [id number]  HPI  Kimberly Glass is a 27 YO female with a history of migraines, endometriosis, and anxiety presents today with a 4 day history of feeling dizzy, lightheaded, shaky, and nauseous.  She was seen on 05/11/13 for migraine headaches and put on Topirimate, and on 05/13/13 for numbness and tingling located on the side of her head. She is currently not taking phenergan or flexeril but states the numbness and tingling only occurs occasionally.  She also denies having headaches currently and began taking her maximum dose of Topirimate this morning.    The patient has a history of anxiety and does state she has been stressed with work lately.  She does feel anxious at times and states that she had been taking Xanax as needed a few years ago for this problem.    The patient has endometriosis and is on 15 mg Norethindrone daily but has stopped taking this 4 days ago due to thinking it may help with her dizziness and lightheadedness.  After discontinuing her Norethindrone, she began having her period shortly after but states her dizziness/lightheadedness has improved mildly but is still currently having these symptoms.  She has begun taking Advil as needed for her cramps for the past 2 days and has a follow-up appointment with her gynecologist in 2 weeks to discuss medication and treatment changes for her endometriosis.     Past Medical History  Diagnosis Date  . Obesity, unspecified   . Acne vulgaris   . Allergic rhinitis, cause unspecified   . Erythema nodosum     associated with menses; improved with OCP  . Essential hypertension, benign   . Anxiety   . Headache, migraine   . Endometriosis dx Jan 2014    Review of Systems  Constitutional: Positive for fatigue. Negative for fever and chills.  HENT: Negative for congestion and rhinorrhea.   Respiratory: Negative for chest tightness, shortness of breath  and wheezing.   Cardiovascular: Negative for chest pain.  Gastrointestinal: Positive for nausea. Negative for vomiting and abdominal pain.  Neurological: Positive for dizziness and light-headedness. Negative for syncope, weakness, numbness and headaches.  Psychiatric/Behavioral: The patient is nervous/anxious.        Objective:   Physical Exam [name] is a pleasant well-developed, well-nourished Caucasian female in no acute distress Heart: Normal rate and rhythm, no murmurs, rubs, or gallops Lungs: Clear to auscultation bilaterally, no wheezing, rhonchi, or rales Neuro: CN II-XII grossly intact.  Equal grip strength bilaterally.   Psychologic: good mood and appropriate affect, normal speech and behavior  BP 124/76  Pulse 106  Temp(Src) 98.8 F (37.1 C) (Oral)  Resp 18  Wt 265 lb (120.203 kg)  BMI 39.12 kg/m2  SpO2 99%  Results for orders placed in visit on 05/19/13  POCT CBC      Result Value Range   WBC 8.8  4.6 - 10.2 K/uL   Lymph, poc 1.8  0.6 - 3.4   POC LYMPH PERCENT 20.5  10 - 50 %L   MID (cbc) 0.5  0 - 0.9   POC MID % 5.4  0 - 12 %M   POC Granulocyte 6.5  2 - 6.9   Granulocyte percent 74.1  37 - 80 %G   RBC 5.11  4.04 - 5.48 M/uL   Hemoglobin 16.1  12.2 - 16.2 g/dL   HCT, POC 40.9 (*) 81.1 - 47.9 %  MCV 94.7  80 - 97 fL   MCH, POC 31.5 (*) 27 - 31.2 pg   MCHC 33.3  31.8 - 35.4 g/dL   RDW, POC 09.8     Platelet Count, POC 515 (*) 142 - 424 K/uL   MPV 8.6  0 - 99.8 fL  GLUCOSE, POCT (MANUAL RESULT ENTRY)      Result Value Range   POC Glucose 90  70 - 99 mg/dl  POCT UA - MICROSCOPIC ONLY      Result Value Range   WBC, Ur, HPF, POC 0-2     RBC, urine, microscopic TNTC     Bacteria, U Microscopic 2+     Mucus, UA positive     Epithelial cells, urine per micros 2-4     Crystals, Ur, HPF, POC neg     Casts, Ur, LPF, POC neg     Yeast, UA neg    POCT URINALYSIS DIPSTICK      Result Value Range   Color, UA yellow     Clarity, UA clear     Glucose, UA neg      Bilirubin, UA neg     Ketones, UA neg     Spec Grav, UA >=1.030     Blood, UA moderate     pH, UA 5.0     Protein, UA neg     Urobilinogen, UA 0.2     Nitrite, UA neg     Leukocytes, UA Negative         Assessment & Plan:  Dizziness - Plan: POCT CBC, POCT glucose (manual entry), EKG 12-Lead, T4, Free  Nausea alone - Plan: POCT UA - Microscopic Only, POCT urinalysis dipstick  Jittery  Hematuria - Plan: Urine culture  Tachycardia - Plan: TSH  Anxiety - Plan: venlafaxine (EFFEXOR) 37.5 MG tablet, ALPRAZolam (XANAX) 0.5 MG tablet  Patient Instructions  Continue the current medications. In one week, start the Effexor, but let me know if you have any new or changing symptoms in the meantime. Let me know how you're feeling in 2-3 weeks (if you're feeling good, you can call or send me a message in My Chart).  I will contact you with your lab results as soon as they are available.   If you have not heard from me in 2 weeks, please contact me.  The fastest way to get your results is to register for My Chart (see the instructions on the last page of this printout).

## 2013-05-20 LAB — URINE CULTURE
Colony Count: NO GROWTH
Organism ID, Bacteria: NO GROWTH

## 2013-05-27 ENCOUNTER — Ambulatory Visit: Payer: BC Managed Care – PPO | Admitting: Physician Assistant

## 2013-05-27 ENCOUNTER — Other Ambulatory Visit: Payer: Self-pay

## 2013-06-01 ENCOUNTER — Encounter: Payer: Self-pay | Admitting: Physician Assistant

## 2013-06-14 ENCOUNTER — Telehealth: Payer: Self-pay | Admitting: Physician Assistant

## 2013-06-14 DIAGNOSIS — F419 Anxiety disorder, unspecified: Secondary | ICD-10-CM

## 2013-06-14 MED ORDER — ALPRAZOLAM 0.5 MG PO TABS: 0.2500 mg | ORAL_TABLET | Freq: Three times a day (TID) | ORAL | Status: DC | PRN

## 2013-06-14 NOTE — Telephone Encounter (Signed)
rx printed.  Meds ordered this encounter  Medications  . ALPRAZolam (XANAX) 0.5 MG tablet    Sig: Take 0.5-1 tablets (0.25-0.5 mg total) by mouth 3 (three) times daily as needed for anxiety.    Dispense:  30 tablet    Refill:  0

## 2013-06-22 ENCOUNTER — Ambulatory Visit (INDEPENDENT_AMBULATORY_CARE_PROVIDER_SITE_OTHER): Payer: BC Managed Care – PPO | Admitting: Physician Assistant

## 2013-06-22 ENCOUNTER — Encounter: Payer: Self-pay | Admitting: Physician Assistant

## 2013-06-22 VITALS — BP 122/97 | HR 75 | Temp 98.4°F | Resp 16 | Ht 69.0 in | Wt 262.0 lb

## 2013-06-22 DIAGNOSIS — I10 Essential (primary) hypertension: Secondary | ICD-10-CM

## 2013-06-22 DIAGNOSIS — F419 Anxiety disorder, unspecified: Secondary | ICD-10-CM

## 2013-06-22 DIAGNOSIS — F411 Generalized anxiety disorder: Secondary | ICD-10-CM

## 2013-06-22 DIAGNOSIS — Z6841 Body Mass Index (BMI) 40.0 and over, adult: Secondary | ICD-10-CM | POA: Insufficient documentation

## 2013-06-22 DIAGNOSIS — G43909 Migraine, unspecified, not intractable, without status migrainosus: Secondary | ICD-10-CM

## 2013-06-22 MED ORDER — VENLAFAXINE HCL ER 75 MG PO CP24: 75.0000 mg | ORAL_CAPSULE | Freq: Every day | ORAL | Status: DC

## 2013-06-22 MED ORDER — LISINOPRIL 5 MG PO TABS: 5.0000 mg | ORAL_TABLET | Freq: Every day | ORAL | Status: DC

## 2013-06-22 NOTE — Progress Notes (Signed)
   Subjective:    Patient ID: Kimberly Glass, female    DOB: 1986/01/07, 27 y.o.   MRN: [id number]  Chief Complaint  Patient presents with  . Medication Refill    effexor  . Follow-up    blood pressure    HPI Is doing "so much better." Stopped the topiramate, which resolved the diarrhea.  Fortunately, her migraines have been well-controlled off it.  When she has had HA, she's found better relief with a dose of alprazolam than ibuprofen, so hasn't needed much butalbital.  Plus, now that she's on the 75 mg dose of Effexor, she's needed less alprazolam in general. The tingling on the side of her head has resolved completely.  Home BP and at GYN 120's/70-80's.  Recently had a Mirena IUD placed, and is tolerating that well, too.  Medications, allergies, past medical history, surgical history, family history, social history and problem list reviewed and updated.   Review of Systems Denies chest pain, shortness of breath, dizziness, vision change, nausea, vomiting, diarrhea, constipation, melena, hematochezia, dysuria, increased urinary urgency or frequency, increased hunger or thirst, unintentional weight change, unexplained myalgias or arthralgias, rash.     Objective:   Physical Exam  Blood pressure 122/97, pulse 75, temperature 98.4 F (36.9 C), temperature source Oral, resp. rate 16, height 5\' 9"  (1.753 m), weight 262 lb (118.842 kg), last menstrual period 06/16/2013, SpO2 98.00%. Body mass index is 38.67 kg/(m^2).  Weight is down several more pounds!  Well-developed, well nourished WF who is awake, alert and oriented, in NAD. HEENT: [place]/AT, sclera and conjunctiva are clear.   Neck: supple, non-tender, no lymphadenopathy, thyromegaly. Heart: RRR, no murmur Lungs: normal effort, CTA Extremities: no cyanosis, clubbing or edema. Skin: warm and dry without rash. Psychologic: good mood and appropriate affect, normal speech and behavior.       Assessment & Plan:  Migraine - much  improved.  Continue current treatment of anxiety, and use alprazolam, ibuprofen, cyclobenzaprine and Fioricet PRN.  Essential hypertension, benign - controlled. Plan: lisinopril (PRINIVIL,ZESTRIL) 5 MG tablet  Anxiety - controlled. Plan: venlafaxine XR (EFFEXOR-XR) 75 MG 24 hr capsule; PRN alprazolam.  Fernande Bras, PA-C Physician Assistant-Certified [facility]

## 2013-06-22 NOTE — Patient Instructions (Signed)
I'll check the status of your tetanus vaccine.  We may need to do that at your next visit.

## 2013-08-25 ENCOUNTER — Other Ambulatory Visit: Payer: Self-pay | Admitting: Physician Assistant

## 2013-08-25 DIAGNOSIS — F419 Anxiety disorder, unspecified: Secondary | ICD-10-CM

## 2013-08-26 MED ORDER — ALPRAZOLAM 0.5 MG PO TABS: 0.2500 mg | ORAL_TABLET | Freq: Three times a day (TID) | ORAL | Status: DC | PRN

## 2013-08-27 NOTE — Telephone Encounter (Signed)
faxed

## 2013-11-05 ENCOUNTER — Other Ambulatory Visit: Payer: Self-pay | Admitting: Physician Assistant

## 2013-11-05 DIAGNOSIS — F419 Anxiety disorder, unspecified: Secondary | ICD-10-CM

## 2013-11-08 MED ORDER — ALPRAZOLAM 0.5 MG PO TABS: 0.2500 mg | ORAL_TABLET | Freq: Three times a day (TID) | ORAL | Status: DC | PRN

## 2013-11-08 NOTE — Telephone Encounter (Signed)
faxed

## 2013-12-21 ENCOUNTER — Ambulatory Visit: Payer: BC Managed Care – PPO | Admitting: Physician Assistant

## 2013-12-22 ENCOUNTER — Encounter: Payer: Self-pay | Admitting: Physician Assistant

## 2013-12-22 MED ORDER — DOXYCYCLINE HYCLATE 100 MG PO CAPS: 100.0000 mg | ORAL_CAPSULE | Freq: Two times a day (BID) | ORAL | Status: DC

## 2014-01-03 ENCOUNTER — Other Ambulatory Visit: Payer: Self-pay | Admitting: Physician Assistant

## 2014-01-04 MED ORDER — DOXYCYCLINE HYCLATE 100 MG PO CAPS: 100.0000 mg | ORAL_CAPSULE | Freq: Two times a day (BID) | ORAL | Status: DC

## 2014-01-11 ENCOUNTER — Encounter (INDEPENDENT_AMBULATORY_CARE_PROVIDER_SITE_OTHER): Payer: Self-pay | Admitting: Surgery

## 2014-01-11 ENCOUNTER — Ambulatory Visit (INDEPENDENT_AMBULATORY_CARE_PROVIDER_SITE_OTHER): Payer: BC Managed Care – PPO | Admitting: Surgery

## 2014-01-11 VITALS — BP 130/74 | HR 116 | Temp 99.0°F | Ht 69.0 in | Wt 285.0 lb

## 2014-01-11 DIAGNOSIS — E669 Obesity, unspecified: Secondary | ICD-10-CM

## 2014-01-11 DIAGNOSIS — L988 Other specified disorders of the skin and subcutaneous tissue: Secondary | ICD-10-CM

## 2014-01-11 NOTE — Patient Instructions (Signed)
Please consider the recommendations that we have given you today:  Consider surgery to remove the pilonidal disease and prevent further attacks.  See the Handout(s) we have given you.  Please call our office at [phone] if you wish to schedule surgery or if you have further questions / concerns.   Pilonidal Cyst A pilonidal cyst occurs when hairs get trapped (ingrown) beneath the skin in the crease between the buttocks over your sacrum (the bone under that crease). Pilonidal cysts are most common in young men with a lot of body hair. When the cyst is ruptured (breaks) or leaking, fluid from the cyst may cause burning and itching. If the cyst becomes infected, it causes a painful swelling filled with pus (abscess). The pus and trapped hairs need to be removed (often by lancing) so that the infection can heal. However, recurrence is common and an operation may be needed to remove the cyst. HOME CARE INSTRUCTIONS   If the cyst was NOT INFECTED:  Keep the area clean and dry. Bathe or shower daily. Wash the area well with a germ-killing soap. Warm tub baths may help prevent infection and help with drainage. Dry the area well with a towel.  Avoid tight clothing to keep area as moisture free as possible.  Keep area between buttocks as free of hair as possible. A depilatory may be used.  If the cyst WAS INFECTED and needed to be drained:  Your caregiver packed the wound with gauze to keep the wound open. This allows the wound to heal from the inside outwards and continue draining.  Return for a wound check in 1 day or as suggested.  If you take tub baths or showers, repack the wound with gauze following them. Sponge baths (at the sink) are a good alternative.  If an antibiotic was ordered to fight the infection, take as directed.  Only take over-the-counter or prescription medicines for pain, discomfort, or fever as directed by your caregiver.  After the drain is removed, use sitz baths  for 20 minutes 4 times per day. Clean the wound gently with mild unscented soap, pat dry, and then apply a dry dressing. SEEK MEDICAL CARE IF:   You have increased pain, swelling, redness, drainage, or bleeding from the area.  You have a fever.  You have muscles aches, dizziness, or a general ill feeling. Document Released: 07/05/2000 Document Revised: 09/30/2011 Document Reviewed: 09/02/2008 [facility] Patient Information 2015 [place], [place]. This information is not intended to replace advice given to you by your health care Json Koelzer. Make sure you discuss any questions you have with your health care Errick Salts.  GENERAL SURGERY: POST OP INSTRUCTIONS  1. DIET: Follow a light bland diet the first 24 hours after arrival home, such as soup, liquids, crackers, etc.  Be sure to include lots of fluids daily.  Avoid fast food or heavy meals as your are more likely to get nauseated.   2. Take your usually prescribed home medications unless otherwise directed. 3. PAIN CONTROL: a. Pain is best controlled by a usual combination of three different methods TOGETHER: i. Ice/Heat ii. Over the counter pain medication iii. Prescription pain medication b. Most patients will experience some swelling and bruising around the incisions.  Ice packs or heating pads (30-60 minutes up to 6 times a day) will help. Use ice for the first few days to help decrease swelling and bruising, then switch to heat to help relax tight/sore spots and speed recovery.  Some people prefer to use  ice alone, heat alone, alternating between ice & heat.  Experiment to what works for you.  Swelling and bruising can take several weeks to resolve.   c. It is helpful to take an over-the-counter pain medication regularly for the first few weeks.  Choose one of the following that works best for you: i. Naproxen (Aleve, etc)  Two 220mg  tabs twice a day ii. Ibuprofen (Advil, etc) Three 200mg  tabs four times a day (every meal &  bedtime) iii. Acetaminophen (Tylenol, etc) 500-650mg  four times a day (every meal & bedtime) d. A  prescription for pain medication (such as oxycodone, hydrocodone, etc) should be given to you upon discharge.  Take your pain medication as prescribed.  i. If you are having problems/concerns with the prescription medicine (does not control pain, nausea, vomiting, rash, itching, etc), please call us [phone] to see if we need to switch you to a different pain medicine that will work better for you and/or control your side effect better. ii. If you need a refill on your pain medication, please contact your pharmacy.  They will contact our office to request authorization. Prescriptions will not be filled after 5 pm or on week-ends. 4. Avoid getting constipated.  Between the surgery and the pain medications, it is common to experience some constipation.  Increasing fluid intake and taking a fiber supplement (such as Metamucil, Citrucel, FiberCon, MiraLax, etc) 1-2 times a day regularly will usually help prevent this problem from occurring.  A mild laxative (prune juice, Milk of Magnesia, MiraLax, etc) should be taken according to package directions if there are no bowel movements after 48 hours.   5. Wash / shower every day.  You may shower over the dressings as they are waterproof.  Continue to shower over incision(s) after the dressing is off. 6. Remove your waterproof bandages 5 days after surgery.  You may leave the incision open to air.  You may have skin tapes (Steri Strips) covering the incision(s).  Leave them on until one week, then remove.  You may replace a dressing/Band-Aid to cover the incision for comfort if you wish.      7. ACTIVITIES as tolerated:   a. You may resume regular (light) daily activities beginning the next day-such as daily self-care, walking, climbing stairs-gradually increasing activities as tolerated.  If you can walk 30 minutes without difficulty, it is safe to try more  intense activity such as jogging, treadmill, bicycling, low-impact aerobics, swimming, etc. b. Save the most intensive and strenuous activity for last such as sit-ups, heavy lifting, contact sports, etc  Refrain from any heavy lifting or straining until you are off narcotics for pain control.   c. DO NOT PUSH THROUGH PAIN.  Let pain be your guide: If it hurts to do something, don't do it.  Pain is your body warning you to avoid that activity for another week until the pain goes down. d. You may drive when you are no longer taking prescription pain medication, you can comfortably wear a seatbelt, and you can safely maneuver your car and apply brakes. e. Bonita QuinYou may have sexual intercourse when it is comfortable.  8. FOLLOW UP in our office a. Please call [facility] at [phone] to set up an appointment to see your surgeon in the office for a follow-up appointment approximately 2-3 weeks after your surgery. b. Make sure that you call for this appointment the day you arrive home to insure a convenient appointment time. 9. IF YOU HAVE DISABILITY OR  FAMILY LEAVE FORMS, BRING THEM TO THE OFFICE FOR PROCESSING.  DO NOT GIVE THEM TO YOUR DOCTOR.   WHEN TO CALL us [phone]: 1. Poor pain control 2. Reactions / problems with new medications (rash/itching, nausea, etc)  3. Fever over 101.5 F (38.5 C) 4. Worsening swelling or bruising 5. Continued bleeding from incision. 6. Increased pain, redness, or drainage from the incision 7. Difficulty breathing / swallowing   The clinic staff is available to answer your questions during regular business hours (8:30am-5pm).  Please don't hesitate to call and ask to speak to one of our nurses for clinical concerns.   If you have a medical emergency, go to the nearest emergency room or call 911.  A surgeon from [facility] is always on call at the [facility], [place] [address], [place], [place]  [address]  ? MAIN: [phone] ? TOLL FREE: [phone] ?  FAX [phone] [email or web address]

## 2014-01-11 NOTE — Progress Notes (Signed)
Subjective:     Patient ID: Kimberly Glass, female   DOB: 03-20-1986, 28 y.o.   MRN: [id number]  HPI  Note: Portions of this report may have been transcribed using voice recognition software. Every effort was made to ensure accuracy; however, inadvertent computerized transcription errors may be present.   Any transcriptional errors that result from this process are unintentional.            Kimberly Glass  Nov 24, 1985 [id number]  Patient Care Team: Fernande Bras, PA-C as PCP - General (Physician Assistant)  This patient is a 28 y.o.female who presents today for surgical evaluation at the request of [facility] PA-C.   Reason for visit: Her pilonidal abscesses  Pleasant obese female.  Has had infections on her tailbone many times.  It required incision and drainage in the emergency room.  Very painful.  Usually responded after doxycycline.  Last drainage a year ago.  However, she felt worsening pain and was concern about another infection.  Started back on doxycycline.  Getting under better control.  This tends to happen in the summer when things were more hot and sweaty.  No history of fall or trauma.  No fevers or chills.  No history of MRSA.  Because of a fourth attack, surgical consultation recommended to see if more definitive treatment could be done.  Patient Active Problem List   Diagnosis Date Noted  . Pilonidal disease 01/11/2014  . Anxiety state, unspecified 06/22/2013  . Obesity (BMI 30-39.9) 06/22/2013  . Migraine 05/10/2013  . Endometriosis   . Irregular menses 07/30/2012  . Dysmenorrhea 07/30/2012  . Female pelvic pain 07/30/2012  . Vulvar lesion 07/30/2012  . Essential hypertension, benign     Past Medical History  Diagnosis Date  . Obesity, unspecified   . Acne vulgaris   . Allergic rhinitis, cause unspecified   . Erythema nodosum     associated with menses; improved with OCP  . Essential hypertension, benign   . Anxiety   . Headache, migraine    . Endometriosis dx Jan 2014    Past Surgical History  Procedure Laterality Date  . Wisdom tooth extraction      History   Social History  . Marital Status: Single    Spouse Name: n/a    Number of Children: 0  . Years of Education: college   Occupational History  . APPLICATION WRITER    Social History Main Topics  . Smoking status: Never Smoker   . Smokeless tobacco: Not on file  . Alcohol Use: 0.6 oz/week    1 Cans of beer per week     Comment: rarely   . Drug Use: No  . Sexual Activity: No   Other Topics Concern  . Not on file   Social History Narrative   Lives with her parents while saving money for a house to purchase.    Family History  Problem Relation Age of Onset  . Heart disease Father 60    AMI; stents  . Hypertension Father   . Hyperlipidemia Father   . Diabetes Maternal Grandmother   . Endometriosis Maternal Grandmother   . Cancer Mother     kidney  . Endometriosis Mother   . Cancer Maternal Grandfather   . Stroke Paternal Grandfather   . Hypertension Paternal Grandfather   . Hyperlipidemia Paternal Grandfather   . Heart disease Paternal Grandfather     Current Outpatient Prescriptions  Medication Sig Dispense Refill  . ALPRAZolam (XANAX) 0.5 MG  tablet Take 0.5-1 tablets (0.25-0.5 mg total) by mouth 3 (three) times daily as needed for anxiety.  30 tablet  0  . doxycycline (VIBRAMYCIN) 100 MG capsule Take 1 capsule (100 mg total) by mouth 2 (two) times daily.  20 capsule  0  . levonorgestrel (MIRENA) 20 MCG/24HR IUD 1 each by Intrauterine route once.      Marland Kitchen. lisinopril (PRINIVIL,ZESTRIL) 5 MG tablet Take 1 tablet (5 mg total) by mouth daily.  90 tablet  3  . Multiple Vitamins-Minerals (MULTIVITAMIN PO) Take by mouth.      . venlafaxine XR (EFFEXOR-XR) 75 MG 24 hr capsule Take 1 capsule (75 mg total) by mouth daily with breakfast.  90 capsule  3   No current facility-administered medications for this visit.     Allergies  Allergen Reactions    . Diphenhydramine Other (See Comments)    Hyperactivity  . Topiramate Diarrhea  . Yasmin [Drospirenone-Ethinyl Estradiol] Other (See Comments)    Migraine Headache  . Penicillins Hives    BP 130/74  Pulse 116  Temp(Src) 99 F (37.2 C)  Ht 5\' 9"  (1.753 m)  Wt 285 lb (129.275 kg)  BMI 42.07 kg/m2  No results found.   Review of Systems  Constitutional: Negative for fever, chills, diaphoresis, appetite change and fatigue.  HENT: Negative for ear discharge, ear pain, sore throat and trouble swallowing.   Eyes: Negative for photophobia, discharge and visual disturbance.  Respiratory: Negative for cough, choking, chest tightness and shortness of breath.   Cardiovascular: Negative for chest pain and palpitations.  Gastrointestinal: Negative for nausea, vomiting, abdominal pain, diarrhea, constipation, anal bleeding and rectal pain.  Endocrine: Negative for cold intolerance and heat intolerance.  Genitourinary: Negative for dysuria, frequency and difficulty urinating.  Musculoskeletal: Positive for back pain. Negative for gait problem, myalgias and neck pain.  Skin: Positive for wound. Negative for color change, pallor and rash.  Allergic/Immunologic: Negative for environmental allergies, food allergies and immunocompromised state.  Neurological: Negative for dizziness, speech difficulty, weakness and numbness.  Hematological: Negative for adenopathy.  Psychiatric/Behavioral: Negative for confusion and agitation. The patient is not nervous/anxious.        Objective:   Physical Exam  Constitutional: She is oriented to person, place, and time. She appears well-developed and well-nourished. No distress.  HENT:  Head: Normocephalic.  Mouth/Throat: Oropharynx is clear and moist. No oropharyngeal exudate.  Eyes: Conjunctivae and EOM are normal. Pupils are equal, round, and reactive to light. No scleral icterus.  Neck: Normal range of motion. Neck supple. No tracheal deviation present.   Cardiovascular: Normal rate, regular rhythm and intact distal pulses.   Pulmonary/Chest: Effort normal and breath sounds normal. No stridor. No respiratory distress. She exhibits no tenderness.  Abdominal: Soft. She exhibits no distension and no mass. There is no tenderness. Hernia confirmed negative in the right inguinal area and confirmed negative in the left inguinal area.  Genitourinary: No vaginal discharge found.  Musculoskeletal: Normal range of motion. She exhibits no tenderness.       Right elbow: She exhibits normal range of motion.       Left elbow: She exhibits normal range of motion.       Right wrist: She exhibits normal range of motion.       Left wrist: She exhibits normal range of motion.       Back:       Right hand: Normal strength noted.       Left hand: Normal strength noted.  Lymphadenopathy:  Head (right side): No posterior auricular adenopathy present.       Head (left side): No posterior auricular adenopathy present.    She has no cervical adenopathy.    She has no axillary adenopathy.       Right: No inguinal adenopathy present.       Left: No inguinal adenopathy present.  Neurological: She is alert and oriented to person, place, and time. No cranial nerve deficit. She exhibits normal muscle tone. Coordination normal.  Skin: Skin is warm and dry. No rash noted. She is not diaphoretic. No erythema.  Psychiatric: She has a normal mood and affect. Her behavior is normal. Judgment and thought content normal.       Assessment:     Mild but very sensitive pilonidal disease.  Recurrent abscesses.     Plan:     Because she has had 3 infections and has a fourth currently occurring, it is reasonable to excise the area.  Hopefully not a large excision.  She is interested in proceeding.  I discussed with her:  The anatomy of the intragluteal cleft was discussed. Pathophysiology of pilonidal disease was discussed. The importance of keeping hairs trimmed to avoid  recurrence was discussed. Discussion of options such as curretage, excision with closure vs leaving open was discussed. Risks of infection and need for incision and drainage & antibiotics were discussed.  I noted a good likelihood this will help address the problem.    At this point, I think the patient would best served with considering surgery to excise the diseased tissue. I will make an attempt to close but it may need to be left open to allow it to heal with secondary intention and wound packing. Possible recurrences involve muscle flaps or different techniques were discussed as well. I noted that recurrence is higher with poor compliance on care maintenance and hygiene. The patient's questions were answered. The patient agrees to proceed.

## 2014-02-08 ENCOUNTER — Telehealth (INDEPENDENT_AMBULATORY_CARE_PROVIDER_SITE_OTHER): Payer: Self-pay | Admitting: Surgery

## 2014-02-18 ENCOUNTER — Other Ambulatory Visit: Payer: Self-pay | Admitting: Physician Assistant

## 2014-02-18 DIAGNOSIS — F419 Anxiety disorder, unspecified: Secondary | ICD-10-CM

## 2014-02-20 MED ORDER — ALPRAZOLAM 0.5 MG PO TABS
0.2500 mg | ORAL_TABLET | Freq: Three times a day (TID) | ORAL | Status: DC | PRN
Start: ? — End: 2014-05-25

## 2014-02-20 NOTE — Telephone Encounter (Signed)
Done please send to the pharmacy.

## 2014-02-21 NOTE — Telephone Encounter (Signed)
Faxed

## 2014-02-22 ENCOUNTER — Ambulatory Visit: Payer: BC Managed Care – PPO | Admitting: Physician Assistant

## 2014-03-23 ENCOUNTER — Ambulatory Visit (INDEPENDENT_AMBULATORY_CARE_PROVIDER_SITE_OTHER): Payer: BC Managed Care – PPO | Admitting: Physician Assistant

## 2014-03-23 VITALS — BP 110/79 | HR 87 | Temp 98.6°F | Resp 18 | Ht 70.0 in | Wt 291.0 lb

## 2014-03-23 DIAGNOSIS — M722 Plantar fascial fibromatosis: Secondary | ICD-10-CM

## 2014-03-23 DIAGNOSIS — Z23 Encounter for immunization: Secondary | ICD-10-CM

## 2014-03-23 DIAGNOSIS — Z0000 Encounter for general adult medical examination without abnormal findings: Secondary | ICD-10-CM

## 2014-03-23 DIAGNOSIS — I10 Essential (primary) hypertension: Secondary | ICD-10-CM

## 2014-03-23 DIAGNOSIS — F411 Generalized anxiety disorder: Secondary | ICD-10-CM

## 2014-03-23 DIAGNOSIS — F419 Anxiety disorder, unspecified: Secondary | ICD-10-CM

## 2014-03-23 LAB — POCT URINALYSIS DIPSTICK
Bilirubin, UA: NEGATIVE
Blood, UA: NEGATIVE
GLUCOSE UA: NEGATIVE
Ketones, UA: NEGATIVE
Nitrite, UA: NEGATIVE
Protein, UA: NEGATIVE
SPEC GRAV UA: 1.015
Urobilinogen, UA: 0.2
pH, UA: 7.5

## 2014-03-23 LAB — CBC WITH DIFFERENTIAL/PLATELET
BASOS ABS: 0.1 10*3/uL (ref 0.0–0.1)
Basophils Relative: 1 % (ref 0–1)
EOS PCT: 1 % (ref 0–5)
Eosinophils Absolute: 0.1 10*3/uL (ref 0.0–0.7)
HEMATOCRIT: 42.7 % (ref 36.0–46.0)
HEMOGLOBIN: 14.9 g/dL (ref 12.0–15.0)
LYMPHS PCT: 26 % (ref 12–46)
Lymphs Abs: 2 10*3/uL (ref 0.7–4.0)
MCH: 30.9 pg (ref 26.0–34.0)
MCHC: 34.9 g/dL (ref 30.0–36.0)
MCV: 88.6 fL (ref 78.0–100.0)
MONO ABS: 0.5 10*3/uL (ref 0.1–1.0)
MONOS PCT: 6 % (ref 3–12)
NEUTROS ABS: 5 10*3/uL (ref 1.7–7.7)
Neutrophils Relative %: 66 % (ref 43–77)
Platelets: 469 10*3/uL — ABNORMAL HIGH (ref 150–400)
RBC: 4.82 MIL/uL (ref 3.87–5.11)
RDW: 12.9 % (ref 11.5–15.5)
WBC: 7.6 10*3/uL (ref 4.0–10.5)

## 2014-03-23 LAB — COMPREHENSIVE METABOLIC PANEL
ALBUMIN: 4.7 g/dL (ref 3.5–5.2)
ALT: 17 U/L (ref 0–35)
AST: 14 U/L (ref 0–37)
Alkaline Phosphatase: 64 U/L (ref 39–117)
BUN: 10 mg/dL (ref 6–23)
CALCIUM: 10 mg/dL (ref 8.4–10.5)
CHLORIDE: 102 meq/L (ref 96–112)
CO2: 27 meq/L (ref 19–32)
CREATININE: 0.61 mg/dL (ref 0.50–1.10)
Glucose, Bld: 89 mg/dL (ref 70–99)
POTASSIUM: 4.3 meq/L (ref 3.5–5.3)
Sodium: 138 mEq/L (ref 135–145)
Total Bilirubin: 0.6 mg/dL (ref 0.2–1.2)
Total Protein: 7.6 g/dL (ref 6.0–8.3)

## 2014-03-23 LAB — LIPID PANEL
CHOL/HDL RATIO: 4.5 ratio
CHOLESTEROL: 199 mg/dL (ref 0–200)
HDL: 44 mg/dL (ref >39)
LDL Cholesterol: 118 mg/dL — ABNORMAL HIGH (ref 0–99)
Triglycerides: 184 mg/dL — ABNORMAL HIGH (ref <150)
VLDL: 37 mg/dL (ref 0–40)

## 2014-03-23 LAB — POCT UA - MICROSCOPIC ONLY
CASTS, UR, LPF, POC: NEGATIVE
Crystals, Ur, HPF, POC: NEGATIVE
Mucus, UA: NEGATIVE
RBC, URINE, MICROSCOPIC: NEGATIVE
Yeast, UA: NEGATIVE

## 2014-03-23 LAB — TSH: TSH: 1.786 u[IU]/mL (ref 0.350–4.500)

## 2014-03-23 MED ORDER — LISINOPRIL 5 MG PO TABS: 5.0000 mg | ORAL_TABLET | Freq: Every day | ORAL | Status: DC

## 2014-03-23 MED ORDER — VENLAFAXINE HCL ER 75 MG PO CP24: 75.0000 mg | ORAL_CAPSULE | Freq: Every day | ORAL | Status: DC

## 2014-03-23 NOTE — Patient Instructions (Signed)
I will contact you with your lab results as soon as they are available.   If you have not heard from me in 2 weeks, please contact me.  The fastest way to get your results is to register for My Chart (see the instructions on the last page of this printout).  Plantar Fasciitis (Heel Spur Syndrome) with Rehab The plantar fascia is a fibrous, ligament-like, soft-tissue structure that spans the bottom of the foot. Plantar fasciitis is a condition that causes pain in the foot due to inflammation of the tissue. SYMPTOMS   Pain and tenderness on the underneath side of the foot.  Pain that worsens with standing or walking. CAUSES  Plantar fasciitis is caused by irritation and injury to the plantar fascia on the underneath side of the foot. Common mechanisms of injury include:  Direct trauma to bottom of the foot.  Damage to a small nerve that runs under the foot where the main fascia attaches to the heel bone.  Stress placed on the plantar fascia due to bone spurs. RISK INCREASES WITH:   Activities that place stress on the plantar fascia (running, jumping, pivoting, or cutting).  Poor strength and flexibility.  Improperly fitted shoes.  Tight calf muscles.  Flat feet.  Failure to warm-up properly before activity.  Obesity. PREVENTION  Warm up and stretch properly before activity.  Allow for adequate recovery between workouts.  Maintain physical fitness:  Strength, flexibility, and endurance.  Cardiovascular fitness.  Maintain a health body weight.  Avoid stress on the plantar fascia.  Wear properly fitted shoes, including arch supports for individuals who have flat feet. PROGNOSIS  If treated properly, then the symptoms of plantar fasciitis usually resolve without surgery. However, occasionally surgery is necessary. RELATED COMPLICATIONS   Recurrent symptoms that may result in a chronic condition.  Problems of the lower back that are caused by compensating for the  injury, such as limping.  Pain or weakness of the foot during push-off following surgery.  Chronic inflammation, scarring, and partial or complete fascia tear, occurring more often from repeated injections. TREATMENT  Treatment initially involves the use of ice and medication to help reduce pain and inflammation. The use of strengthening and stretching exercises may help reduce pain with activity, especially stretches of the Achilles tendon. These exercises may be performed at home or with a therapist. Your caregiver may recommend that you use heel cups of arch supports to help reduce stress on the plantar fascia. Occasionally, corticosteroid injections are given to reduce inflammation. If symptoms persist for greater than 6 months despite non-surgical (conservative), then surgery may be recommended.  MEDICATION   If pain medication is necessary, then nonsteroidal anti-inflammatory medications, such as aspirin and ibuprofen, or other minor pain relievers, such as acetaminophen, are often recommended.  Do not take pain medication within 7 days before surgery.  Prescription pain relievers may be given if deemed necessary by your caregiver. Use only as directed and only as much as you need.  Corticosteroid injections may be given by your caregiver. These injections should be reserved for the most serious cases, because they may only be given a certain number of times. HEAT AND COLD  Cold treatment (icing) relieves pain and reduces inflammation. Cold treatment should be applied for 10 to 15 minutes every 2 to 3 hours for inflammation and pain and immediately after any activity that aggravates your symptoms. Use ice packs or massage the area with a piece of ice (ice massage).  Heat treatment may be  used prior to performing the stretching and strengthening activities prescribed by your caregiver, physical therapist, or athletic trainer. Use a heat pack or soak the injury in warm water. SEEK IMMEDIATE  MEDICAL CARE IF:  Treatment seems to offer no benefit, or the condition worsens.  Any medications produce adverse side effects. EXERCISES RANGE OF MOTION (ROM) AND STRETCHING EXERCISES - Plantar Fasciitis (Heel Spur Syndrome) These exercises may help you when beginning to rehabilitate your injury. Your symptoms may resolve with or without further involvement from your physician, physical therapist or athletic trainer. While completing these exercises, remember:   Restoring tissue flexibility helps normal motion to return to the joints. This allows healthier, less painful movement and activity.  An effective stretch should be held for at least 30 seconds.  A stretch should never be painful. You should only feel a gentle lengthening or release in the stretched tissue. RANGE OF MOTION - Toe Extension, Flexion  Sit with your right / left leg crossed over your opposite knee.  Grasp your toes and gently pull them back toward the top of your foot. You should feel a stretch on the bottom of your toes and/or foot.  Hold this stretch for ____5-10______ seconds.  Now, gently pull your toes toward the bottom of your foot. You should feel a stretch on the top of your toes and or foot.  Hold this stretch for ____5-10______ seconds. Repeat ____5-10______ times. Complete this stretch _____1-2_____ times per day.  RANGE OF MOTION - Ankle Dorsiflexion, Active Assisted  Remove shoes and sit on a chair that is preferably not on a carpeted surface.  Place right / left foot under knee. Extend your opposite leg for support.  Keeping your heel down, slide your right / left foot back toward the chair until you feel a stretch at your ankle or calf. If you do not feel a stretch, slide your bottom forward to the edge of the chair, while still keeping your heel down.  Hold this stretch for _____5-10_____ seconds. Repeat ____5-10______ times. Complete this stretch ____1-2______ times per day.  STRETCH -  Gastroc, Standing  Place hands on wall.  Extend right / left leg, keeping the front knee somewhat bent.  Slightly point your toes inward on your back foot.  Keeping your right / left heel on the floor and your knee straight, shift your weight toward the wall, not allowing your back to arch.  You should feel a gentle stretch in the right / left calf. Hold this position for __________ seconds. Repeat __________ times. Complete this stretch __________ times per day. STRETCH - Soleus, Standing  Place hands on wall.  Extend right / left leg, keeping the other knee somewhat bent.  Slightly point your toes inward on your back foot.  Keep your right / left heel on the floor, bend your back knee, and slightly shift your weight over the back leg so that you feel a gentle stretch deep in your back calf.  Hold this position for ____5-10______ seconds. Repeat ____5-10______ times. Complete this stretch ____1-2______ times per day. STRETCH - Gastrocsoleus, Standing  Note: This exercise can place a lot of stress on your foot and ankle. Please complete this exercise only if specifically instructed by your caregiver.   Place the ball of your right / left foot on a step, keeping your other foot firmly on the same step.  Hold on to the wall or a rail for balance.  Slowly lift your other foot, allowing your body weight  to press your heel down over the edge of the step.  You should feel a stretch in your right / left calf.  Hold this position for ____5-10______ seconds.  Repeat this exercise with a slight bend in your right / left knee. Repeat ____5-10______ times. Complete this stretch _____1-2_____ times per day.  STRENGTHENING EXERCISES - Plantar Fasciitis (Heel Spur Syndrome)  These exercises may help you when beginning to rehabilitate your injury. They may resolve your symptoms with or without further involvement from your physician, physical therapist or athletic trainer. While completing  these exercises, remember:   Muscles can gain both the endurance and the strength needed for everyday activities through controlled exercises.  Complete these exercises as instructed by your physician, physical therapist or athletic trainer. Progress the resistance and repetitions only as guided. STRENGTH - Towel Curls  Sit in a chair positioned on a non-carpeted surface.  Place your foot on a towel, keeping your heel on the floor.  Pull the towel toward your heel by only curling your toes. Keep your heel on the floor.  If instructed by your physician, physical therapist or athletic trainer, add ____________________ at the end of the towel. Repeat ____5-10______ times. Complete this exercise ____1-2______ times per day. STRENGTH - Ankle Inversion  Secure one end of a rubber exercise band/tubing to a fixed object (table, pole). Loop the other end around your foot just before your toes.  Place your fists between your knees. This will focus your strengthening at your ankle.  Slowly, pull your big toe up and in, making sure the band/tubing is positioned to resist the entire motion.  Hold this position for _____5-10_____ seconds.  Have your muscles resist the band/tubing as it slowly pulls your foot back to the starting position. Repeat _____5-10_____ times. Complete this exercises ____1-2______ times per day.  Document Released: 07/08/2005 Document Revised: 09/30/2011 Document Reviewed: 10/20/2008 [facility] Patient Information 2015 [place], [place]. This information is not intended to replace advice given to you by your health care provider. Make sure you discuss any questions you have with your health care provider.   Keeping You Healthy  Get These Tests 1. Blood Pressure- Have your blood pressure checked once a year by your health care provider.  Normal blood pressure is 120/80. 2. Weight- Have your body mass index (BMI) calculated to screen for obesity.  BMI is measure of body fat  based on height and weight.  You can also calculate your own BMI at https://www.west-esparza.com/. 3. Cholesterol- Have your cholesterol checked every 5 years starting at age 31 then yearly starting at age 61. 4. Chlamydia, HIV, and other sexually transmitted diseases- Get screened every year until age 75, then within three months of each new sexual provider. 5. Pap Smear- Every 1-3 years; discuss with your health care provider. 6. Mammogram- Every year starting at age 59  Take these medicines  Calcium with Vitamin D-Your body needs 1200 mg of Calcium each day and [phone] IU of Vitamin D daily.  Your body can only absorb 500 mg of Calcium at a time so Calcium must be taken in 2 or 3 divided doses throughout the day.  Multivitamin with folic acid- Once daily if it is possible for you to become pregnant.  Get these Immunizations  Gardasil-Series of three doses; prevents HPV related illness such as genital warts and cervical cancer.  Menactra-Single dose; prevents meningitis.  Tetanus shot- Every 10 years.  Flu shot-Every year.  Take these steps 1. Do not smoke-Your healthcare provider  can help you quit.  For tips on how to quit go to www.smokefree.gov or call 1-800 QUITNOW. 2. Be physically active- Exercise 5 days a week for at least 30 minutes.  If you are not already physically active, start slow and gradually work up to 30 minutes of moderate physical activity.  Examples of moderate activity include walking briskly, dancing, swimming, bicycling, etc. 3. Breast Cancer- A self breast exam every month is important for early detection of breast cancer.  For more information and instruction on self breast exams, ask your healthcare provider or [place]. 4. Eat a healthy diet- Eat a variety of healthy foods such as fruits, vegetables, whole grains, low fat milk, low fat cheeses, yogurt, lean meats, poultry and fish, beans, nuts, tofu, etc.  For more information  go to www. Thenutritionsource.org 5. Drink alcohol in moderation- Limit alcohol intake to one drink or less per day. Never drink and drive. 6. Depression- Your emotional health is as important as your physical health.  If you're feeling down or losing interest in things you normally enjoy please talk to your healthcare provider about being screened for depression. 7. Dental visit- Brush and floss your teeth twice daily; visit your dentist twice a year. 8. Eye doctor- Get an eye exam at least every 2 years. 9. Helmet use- Always wear a helmet when riding a bicycle, motorcycle, rollerblading or skateboarding. 10. Safe sex- If you may be exposed to sexually transmitted infections, use a condom. 11. Seat belts- Seat belts can save your live; always wear one. 12. Smoke/Carbon Monoxide detectors- These detectors need to be installed on the appropriate level of your home. Replace batteries at least once a year. 13. Skin cancer- When out in the sun please cover up and use sunscreen 15 SPF or higher. 14. Violence- If anyone is threatening or hurting you, please tell your healthcare provider.

## 2014-03-23 NOTE — Progress Notes (Signed)
Subjective:    Patient ID: Kimberly Glass, female    DOB: [birth year], 28 y.o.   MRN: [id number]   PCP: Bayley Hurn, PA-C  Chief Complaint  Patient presents with  . Annual Exam      Active Ambulatory Problems    Diagnosis Date Noted  . Essential hypertension, benign   . Irregular menses 07/30/2012  . Dysmenorrhea 07/30/2012  . Female pelvic pain 07/30/2012  . Vulvar lesion 07/30/2012  . Endometriosis   . Migraine 05/10/2013  . Anxiety state, unspecified 06/22/2013  . Obesity (BMI 30-39.9) 06/22/2013  . Pilonidal disease 01/11/2014   Resolved Ambulatory Problems    Diagnosis Date Noted  . No Resolved Ambulatory Problems   Past Medical History  Diagnosis Date  . Obesity, unspecified   . Acne vulgaris   . Allergic rhinitis, cause unspecified   . Erythema nodosum   . Anxiety   . Headache, migraine   . H/O pilonidal cyst     Past Surgical History  Procedure Laterality Date  . Wisdom tooth extraction      Allergies  Allergen Reactions  . Diphenhydramine Other (See Comments)    Hyperactivity  . Topiramate Diarrhea  . Yasmin [Drospirenone-Ethinyl Estradiol] Other (See Comments)    Migraine Headache  . Penicillins Hives    Prior to Admission medications   Medication Sig Start Date End Date Taking? Authorizing Provider  ALPRAZolam Prudy Feeler) 0.5 MG tablet Take 0.5-1 tablets (0.25-0.5 mg total) by mouth 3 (three) times daily as needed for anxiety.   Yes Morrell Riddle, PA-C  levonorgestrel (MIRENA) 20 MCG/24HR IUD 1 each by Intrauterine route once.   Yes Historical Provider, MD  lisinopril (PRINIVIL,ZESTRIL) 5 MG tablet Take 1 tablet (5 mg total) by mouth daily. 06/22/13  Yes Tarek Cravens S Avacyn Kloosterman, PA-C  Multiple Vitamins-Minerals (MULTIVITAMIN PO) Take by mouth.   Yes Historical Provider, MD  venlafaxine XR (EFFEXOR-XR) 75 MG 24 hr capsule Take 1 capsule (75 mg total) by mouth daily with breakfast. 06/22/13  Yes Lazer Wollard Tessa Lerner, PA-C    History   Social History    . Marital Status: Single    Spouse Name: n/a    Number of Children: 0  . Years of Education: college   Occupational History  . Business Analyst     [employer]   Social History Main Topics  . Smoking status: Never Smoker   . Smokeless tobacco: Never Used  . Alcohol Use: 0.6 oz/week    1 Cans of beer per week     Comment: rarely   . Drug Use: No  . Sexual Activity: Yes    Partners: Male    Birth Control/ Protection: IUD   Other Topics Concern  . None   Social History Narrative   Lives with her parents, moving in to the house she purchased 03/27/2014.    family history includes Cancer in her maternal grandfather and mother; Diabetes in her maternal grandmother; Endometriosis in her maternal grandmother and mother; Heart disease in her paternal grandfather; Heart disease (age of onset: 27) in her father; Hyperlipidemia in her father and paternal grandfather; Hypertension in her father and paternal grandfather; Stroke in her paternal grandfather. indicated that her mother is alive. She indicated that her father is alive. She indicated that her brother is alive. She indicated that her maternal grandmother is alive. She indicated that her maternal grandfather is alive. She indicated that her paternal grandfather is alive.   HPI  Pap in March 2015 with Dr. Thamas Jaegers  was LSIL. To repeat in the next few weeks.  In general, she's doing well.  She has had bilateral foot pain after lots of walking in [place], [place] and the a business trip.  The pain is worst in the mornings when she gets out of bed, and eases off as she walks around.  It worsens again when she gets up after sitting for a long time.  She also notes intermittent thigh pain.   Review of Systems  Constitutional: Negative.   HENT: Negative.   Eyes: Negative.   Respiratory: Negative.   Cardiovascular: Negative.   Gastrointestinal: Negative.   Endocrine: Negative.   Genitourinary: Negative.   Musculoskeletal: Negative for  arthralgias, back pain, gait problem, joint swelling, myalgias, neck pain and neck stiffness.       See above.  Skin: Negative.   Allergic/Immunologic: Negative.   Neurological: Negative.   Hematological: Negative.   Psychiatric/Behavioral: Negative.        Objective:   Physical Exam  Vitals reviewed. Constitutional: She is oriented to person, place, and time. Vital signs are normal. She appears well-developed and well-nourished. She is active and cooperative. No distress.  HENT:  Head: Normocephalic and atraumatic.  Right Ear: Hearing, tympanic membrane, external ear and ear canal normal. No foreign bodies.  Left Ear: Hearing, tympanic membrane, external ear and ear canal normal. No foreign bodies.  Nose: Nose normal.  Mouth/Throat: Uvula is midline, oropharynx is clear and moist and mucous membranes are normal. No oral lesions. Normal dentition. No dental abscesses or uvula swelling. No oropharyngeal exudate.  Eyes: Conjunctivae, EOM and lids are normal. Pupils are equal, round, and reactive to light. Right eye exhibits no discharge. Left eye exhibits no discharge. No scleral icterus.  Fundoscopic exam:      The right eye shows no arteriolar narrowing, no AV nicking, no exudate, no hemorrhage and no papilledema. The right eye shows red reflex.       The left eye shows no arteriolar narrowing, no AV nicking, no exudate, no hemorrhage and no papilledema. The left eye shows red reflex.  Neck: Trachea normal, normal range of motion and full passive range of motion without pain. Neck supple. No spinous process tenderness and no muscular tenderness present. No mass and no thyromegaly present.  Cardiovascular: Normal rate, regular rhythm, normal heart sounds, intact distal pulses and normal pulses.   Pulmonary/Chest: Effort normal and breath sounds normal.  Abdominal: Soft. Bowel sounds are normal.  Musculoskeletal: She exhibits no edema and no tenderness.       Cervical back: Normal.        Thoracic back: Normal.       Lumbar back: Normal.  Lymphadenopathy:       Head (right side): No tonsillar, no preauricular, no posterior auricular and no occipital adenopathy present.       Head (left side): No tonsillar, no preauricular, no posterior auricular and no occipital adenopathy present.    She has no cervical adenopathy.       Right: No supraclavicular adenopathy present.       Left: No supraclavicular adenopathy present.  Neurological: She is alert and oriented to person, place, and time. She has normal strength and normal reflexes. No cranial nerve deficit or sensory deficit. She exhibits normal muscle tone. Coordination and gait normal.  Skin: Skin is warm, dry and intact. No rash noted. She is not diaphoretic. No cyanosis or erythema. Nails show no clubbing.  Psychiatric: She has a normal mood and affect. Her  speech is normal and behavior is normal. Judgment and thought content normal.   Results for orders placed in visit on 03/23/14  POCT UA - MICROSCOPIC ONLY      Result Value Ref Range   WBC, Ur, HPF, POC 0-3     RBC, urine, microscopic neg     Bacteria, U Microscopic trace     Mucus, UA neg     Epithelial cells, urine per micros 1-4     Crystals, Ur, HPF, POC neg     Casts, Ur, LPF, POC neg     Yeast, UA neg    POCT URINALYSIS DIPSTICK      Result Value Ref Range   Color, UA yellow     Clarity, UA clear     Glucose, UA neg     Bilirubin, UA neg     Ketones, UA neg     Spec Grav, UA 1.015     Blood, UA neg     pH, UA 7.5     Protein, UA neg     Urobilinogen, UA 0.2     Nitrite, UA neg     Leukocytes, UA Trace            Assessment & Plan:  1. Annual physical exam Age appropriate anticipatory guidance provided.  2. Need for influenza vaccination - Flu Vaccine QUAD 36+ mos IM  3. Essential hypertension, benign Controlled. Continue current treatment and efforts for healthy eating, regular exercise and weight - CBC with Differential - POCT UA -  Microscopic Only - POCT urinalysis dipstick - Comprehensive metabolic panel - Lipid panel - lisinopril (PRINIVIL,ZESTRIL) 5 MG tablet; Take 1 tablet (5 mg total) by mouth daily.  Dispense: 90 tablet; Refill: 3  4. Anxiety Controlled. Continue current treatment. - TSH - venlafaxine XR (EFFEXOR-XR) 75 MG 24 hr capsule; Take 1 capsule (75 mg total) by mouth daily with breakfast.  Dispense: 90 capsule; Refill: 3  5. Plantar fasciitis, bilateral Exercises, ice.  6. Need for Tdap vaccination - Tdap vaccine greater than or equal to 7yo IM   Fernande Bras, PA-C Physician Assistant-Certified [facility]

## 2014-03-24 ENCOUNTER — Encounter: Payer: Self-pay | Admitting: Physician Assistant

## 2014-05-23 ENCOUNTER — Telehealth: Payer: Self-pay | Admitting: Physician Assistant

## 2014-05-23 DIAGNOSIS — F419 Anxiety disorder, unspecified: Secondary | ICD-10-CM

## 2014-05-25 MED ORDER — ALPRAZOLAM 0.5 MG PO TABS: 0.2500 mg | ORAL_TABLET | Freq: Three times a day (TID) | ORAL | Status: DC | PRN

## 2014-05-25 NOTE — Telephone Encounter (Signed)
Faxed

## 2014-05-25 NOTE — Telephone Encounter (Signed)
Patient notified via My Chart.  Meds ordered this encounter  Medications  . ALPRAZolam (XANAX) 0.5 MG tablet    Sig: Take 0.5-1 tablets (0.25-0.5 mg total) by mouth 3 (three) times daily as needed for anxiety.    Dispense:  30 tablet    Refill:  0

## 2014-06-28 ENCOUNTER — Ambulatory Visit (INDEPENDENT_AMBULATORY_CARE_PROVIDER_SITE_OTHER): Payer: BC Managed Care – PPO | Admitting: Physician Assistant

## 2014-06-28 VITALS — BP 130/78 | HR 116 | Temp 98.7°F | Resp 18 | Ht 71.0 in | Wt 294.0 lb

## 2014-06-28 DIAGNOSIS — R0981 Nasal congestion: Secondary | ICD-10-CM

## 2014-06-28 DIAGNOSIS — B9789 Other viral agents as the cause of diseases classified elsewhere: Principal | ICD-10-CM

## 2014-06-28 DIAGNOSIS — J069 Acute upper respiratory infection, unspecified: Secondary | ICD-10-CM

## 2014-06-28 MED ORDER — HYDROCOD POLST-CHLORPHEN POLST 10-8 MG/5ML PO LQCR: 5.0000 mL | Freq: Two times a day (BID) | ORAL | Status: AC | PRN

## 2014-06-28 MED ORDER — GUAIFENESIN ER 1200 MG PO TB12: 1.0000 | ORAL_TABLET | Freq: Two times a day (BID) | ORAL | Status: AC | PRN

## 2014-06-28 MED ORDER — BENZONATATE 100 MG PO CAPS: 100.0000 mg | ORAL_CAPSULE | Freq: Three times a day (TID) | ORAL | Status: AC | PRN

## 2014-06-28 MED ORDER — IPRATROPIUM BROMIDE 0.03 % NA SOLN: 2.0000 | Freq: Two times a day (BID) | NASAL | Status: DC

## 2014-06-28 NOTE — Patient Instructions (Signed)
Upper Respiratory Infection, Adult An upper respiratory infection (URI) is also sometimes known as the common cold. The upper respiratory tract includes the nose, sinuses, throat, trachea, and bronchi. Bronchi are the airways leading to the lungs. Most people improve within 1 week, but symptoms can last up to 2 weeks. A residual cough may last even longer.  CAUSES Many different viruses can infect the tissues lining the upper respiratory tract. The tissues become irritated and inflamed and often become very moist. Mucus production is also common. A cold is contagious. You can easily spread the virus to others by oral contact. This includes kissing, sharing a glass, coughing, or sneezing. Touching your mouth or nose and then touching a surface, which is then touched by another person, can also spread the virus. SYMPTOMS  Symptoms typically develop 1 to 3 days after you come in contact with a cold virus. Symptoms vary from person to person. They may include:  Runny nose.  Sneezing.  Nasal congestion.  Sinus irritation.  Sore throat.  Loss of voice (laryngitis).  Cough.  Fatigue.  Muscle aches.  Loss of appetite.  Headache.  Low-grade fever. DIAGNOSIS  You might diagnose your own cold based on familiar symptoms, since most people get a cold 2 to 3 times a year. Your caregiver can confirm this based on your exam. Most importantly, your caregiver can check that your symptoms are not due to another disease such as strep throat, sinusitis, pneumonia, asthma, or epiglottitis. Blood tests, throat tests, and X-rays are not necessary to diagnose a common cold, but they may sometimes be helpful in excluding other more serious diseases. Your caregiver will decide if any further tests are required. RISKS AND COMPLICATIONS  You may be at risk for a more severe case of the common cold if you smoke cigarettes, have chronic heart disease (such as heart failure) or lung disease (such as asthma), or if  you have a weakened immune system. The very young and very old are also at risk for more serious infections. Bacterial sinusitis, middle ear infections, and bacterial pneumonia can complicate the common cold. The common cold can worsen asthma and chronic obstructive pulmonary disease (COPD). Sometimes, these complications can require emergency medical care and may be life-threatening. PREVENTION  The best way to protect against getting a cold is to practice good hygiene. Avoid oral or hand contact with people with cold symptoms. Wash your hands often if contact occurs. There is no clear evidence that vitamin C, vitamin E, echinacea, or exercise reduces the chance of developing a cold. However, it is always recommended to get plenty of rest and practice good nutrition. TREATMENT  Treatment is directed at relieving symptoms. There is no cure. Antibiotics are not effective, because the infection is caused by a virus, not by bacteria. Treatment may include:  Increased fluid intake. Sports drinks offer valuable electrolytes, sugars, and fluids.  Breathing heated mist or steam (vaporizer or shower).  Eating chicken soup or other clear broths, and maintaining good nutrition.  Getting plenty of rest.  Using gargles or lozenges for comfort.  Controlling fevers with ibuprofen or acetaminophen as directed by your caregiver.  Increasing usage of your inhaler if you have asthma. Zinc gel and zinc lozenges, taken in the first 24 hours of the common cold, can shorten the duration and lessen the severity of symptoms. Pain medicines may help with fever, muscle aches, and throat pain. A variety of non-prescription medicines are available to treat congestion and runny nose. Your caregiver   can make recommendations and may suggest nasal or lung inhalers for other symptoms.  HOME CARE INSTRUCTIONS   Only take over-the-counter or prescription medicines for pain, discomfort, or fever as directed by your  caregiver.  Use a warm mist humidifier or inhale steam from a shower to increase air moisture. This may keep secretions moist and make it easier to breathe.  Drink enough water and fluids to keep your urine clear or pale yellow.  Rest as needed.  Return to work when your temperature has returned to normal or as your caregiver advises. You may need to stay home longer to avoid infecting others. You can also use a face mask and careful hand washing to prevent spread of the virus. SEEK MEDICAL CARE IF:   After the first few days, you feel you are getting worse rather than better.  You need your caregiver's advice about medicines to control symptoms.  You develop chills, worsening shortness of breath, or brown or red sputum. These may be signs of pneumonia.  You develop yellow or brown nasal discharge or pain in the face, especially when you bend forward. These may be signs of sinusitis.  You develop a fever, swollen neck glands, pain with swallowing, or white areas in the back of your throat. These may be signs of strep throat. SEEK IMMEDIATE MEDICAL CARE IF:   You have a fever.  You develop severe or persistent headache, ear pain, sinus pain, or chest pain.  You develop wheezing, a prolonged cough, cough up blood, or have a change in your usual mucus (if you have chronic lung disease).  You develop sore muscles or a stiff neck. Document Released: 01/01/2001 Document Revised: 09/30/2011 Document Reviewed: 10/13/2013 Exit[employer] Patient Information 2015 [employer]. This information is not intended to replace advice given to you by your health care provider. Make sure you discuss any questions you have with your health care provider.  

## 2014-06-28 NOTE — Progress Notes (Signed)
MRN: [id number] DOB: March 01, 1986  Subjective:    Chief Complaint  Patient presents with  . Sore Throat    since saturday   . Nasal Congestion  . Headache  . Fever    sunday     Kimberly Glass is a 28 y.o. female presenting for 3 days.  The symptoms started with a sore throat.  The next day, the throat felt better, but she had fever (99.4), runny nose and a frontal sinus headache.  Yesterday, the coughing started, progressively worsening at nighttime.  She medicated with alka-seltzer without much relief.  She notes swollen lymph nodes and right-sided neck tenderness yesterday as well.. She also has associated earfullness, but denies ear pain or discharge.  She is hyrdrating well with water.  She denies nausea, vomiting, diarrhea, but does notice some indigestion.  Boyfriend was sick with the same symptoms last week.    She has a current medication list which includes the following prescription(s): alprazolam, levonorgestrel, lisinopril, multiple vitamins-minerals, and venlafaxine xr.  She is allergic to diphenhydramine; topiramate; yasmin; and penicillins.  She has a past medical history of Obesity, unspecified; Acne vulgaris; Allergic rhinitis, cause unspecified; Erythema nodosum; Essential hypertension, benign; Anxiety; Headache, migraine; Endometriosis (dx Jan 2014); and H/O pilonidal cyst.   She has past surgical history that includes Wisdom tooth extraction.  ROS As in subjective.  Objective:   Vitals: BP 130/78 mmHg  Pulse 116  Temp(Src) 98.7 F (37.1 C) (Oral)  Resp 18  Ht 5\' 11"  (1.803 m)  Wt 294 lb (133.358 kg)  BMI 41.02 kg/m2  SpO2 98%  LMP 06/10/2014  Physical Exam  Constitutional: She is well-developed, well-nourished, and in no distress. No distress.  HENT:  Head: Head is without raccoon's eyes.  Right Ear: Tympanic membrane is retracted. Tympanic membrane is not injected.  Left Ear: Tympanic membrane is retracted. Tympanic membrane is not injected.    Nose: Rhinorrhea present. No mucosal edema. Right sinus exhibits no maxillary sinus tenderness and no frontal sinus tenderness. Left sinus exhibits no maxillary sinus tenderness and no frontal sinus tenderness.  Mouth/Throat: No uvula swelling. No oropharyngeal exudate, posterior oropharyngeal edema or posterior oropharyngeal erythema.  Neck: Normal range of motion. Carotid bruit is not present. No thyroid mass and no thyromegaly present.  Cardiovascular: Regular rhythm, normal heart sounds and intact distal pulses.  Tachycardia present.  Exam reveals no gallop, no distant heart sounds and no friction rub.   No murmur heard. Pulmonary/Chest: Breath sounds normal. No accessory muscle usage. No apnea. No respiratory distress. She has no decreased breath sounds. She has no wheezes.     Assessment and Plan :  28 year old female is here today with chief complaint of cough, nasal congestion, and ear fullness for 3 days. Upper respiratory infection looks of of viral etiology at this time.  Treat her supportively at this time.  RTC in 7 days if symptoms do not resolve, where we attempt to treat her with antibiotic (azithromycin)--penicillin allergy. -Advised to discontinue the alka seltzer.  Pt told to recheck pulse at home and return if tachy continues or becomes symptomatic--likely infection response.  Viral URI with cough Guaifenesin (MUCINEX MAXIMUM STRENGTH) 1200 MG TB12,  chlorpheniramine-HYDROcodone (TUSSIONEX PENNKINETIC ER) 10-8 MG/5ML LQCR,  ipratropium (ATROVENT) 0.03 % nasal spray,  benzonatate (TESSALON) 100 MG capsule  Nasal congestion Guaifenesin (MUCINEX MAXIMUM STRENGTH) 1200 MG TB12, ipratropium (ATROVENT) 0.03 % nasal spray  Trena PlattStephanie Khaya Theissen, PA-C [facility]  12/10/201510:57 AM

## 2014-08-08 ENCOUNTER — Telehealth: Payer: Self-pay | Admitting: Physician Assistant

## 2014-08-08 DIAGNOSIS — F419 Anxiety disorder, unspecified: Secondary | ICD-10-CM

## 2014-08-08 MED ORDER — ALPRAZOLAM 0.5 MG PO TABS: 0.2500 mg | ORAL_TABLET | Freq: Three times a day (TID) | ORAL | Status: DC | PRN

## 2014-08-08 NOTE — Telephone Encounter (Signed)
Meds ordered this encounter  Medications  . ALPRAZolam (XANAX) 0.5 MG tablet    Sig: Take 0.5-1 tablets (0.25-0.5 mg total) by mouth 3 (three) times daily as needed for anxiety.    Dispense:  30 tablet    Refill:  0

## 2014-08-09 NOTE — Telephone Encounter (Signed)
Called pt who req'd that I call in Rx. Done.

## 2014-10-18 ENCOUNTER — Ambulatory Visit (INDEPENDENT_AMBULATORY_CARE_PROVIDER_SITE_OTHER): Payer: BLUE CROSS/BLUE SHIELD | Admitting: Family Medicine

## 2014-10-18 ENCOUNTER — Telehealth: Payer: Self-pay

## 2014-10-18 VITALS — BP 116/80 | HR 114 | Temp 98.1°F | Resp 18

## 2014-10-18 DIAGNOSIS — R110 Nausea: Secondary | ICD-10-CM

## 2014-10-18 DIAGNOSIS — D72829 Elevated white blood cell count, unspecified: Secondary | ICD-10-CM | POA: Diagnosis not present

## 2014-10-18 DIAGNOSIS — L0591 Pilonidal cyst without abscess: Secondary | ICD-10-CM

## 2014-10-18 LAB — POCT CBC
GRANULOCYTE PERCENT: 75 % (ref 37–80)
HEMATOCRIT: 43 % (ref 37.7–47.9)
HEMOGLOBIN: 13.8 g/dL (ref 12.2–16.2)
LYMPH, POC: 3.2 (ref 0.6–3.4)
MCH: 29.2 pg (ref 27–31.2)
MCHC: 32.2 g/dL (ref 31.8–35.4)
MCV: 90.9 fL (ref 80–97)
MID (CBC): 0.7 (ref 0–0.9)
MPV: 6.9 fL (ref 0–99.8)
POC GRANULOCYTE: 11.9 — AB (ref 2–6.9)
POC LYMPH PERCENT: 20.5 %L (ref 10–50)
POC MID %: 4.5 %M (ref 0–12)
Platelet Count, POC: 406 10*3/uL (ref 142–424)
RBC: 4.73 M/uL (ref 4.04–5.48)
RDW, POC: 13.3 %
WBC: 15.8 10*3/uL — AB (ref 4.6–10.2)

## 2014-10-18 MED ORDER — ONDANSETRON 4 MG PO TBDP: 8.0000 mg | ORAL_TABLET | Freq: Once | ORAL | Status: DC

## 2014-10-18 MED ORDER — ERYTHROMYCIN BASE 250 MG PO TABS: 250.0000 mg | ORAL_TABLET | Freq: Four times a day (QID) | ORAL | Status: AC

## 2014-10-18 MED ORDER — CEFTRIAXONE SODIUM 1 G IJ SOLR
1.0000 g | Freq: Once | INTRAMUSCULAR | Status: AC
  Administered 2014-10-18: 1 g via INTRAMUSCULAR

## 2014-10-18 MED ORDER — DOXYCYCLINE HYCLATE 100 MG PO CAPS: 100.0000 mg | ORAL_CAPSULE | Freq: Two times a day (BID) | ORAL | Status: DC

## 2014-10-18 MED ORDER — METRONIDAZOLE 500 MG PO TABS: 500.0000 mg | ORAL_TABLET | Freq: Three times a day (TID) | ORAL | Status: DC

## 2014-10-18 MED ORDER — ERYTHROMYCIN BASE 500 MG PO TABS: 500.0000 mg | ORAL_TABLET | Freq: Four times a day (QID) | ORAL | Status: DC

## 2014-10-18 NOTE — Telephone Encounter (Signed)
Pt states she have developed another cyst, she had one before when seen by Chelle. Wanted to know if she should come here to get it lanced or go to the ER and have surgery done Please call [phone]

## 2014-10-18 NOTE — Telephone Encounter (Signed)
Called pt, Left message for pt to call back.  

## 2014-10-18 NOTE — Patient Instructions (Signed)
You may take up to 800 mg of ibuprofen every 8 hours for pain. You may also add 1 gram of Tylenol every 8 hours to this regimen.  WOUND CARE Please return in 2 days to have your stitches/staples removed or sooner if you have concerns. Marland Kitchen. Keep area clean and dry for 24 hours. Do not remove bandage, if applied. . After 24 hours, remove bandage and wash wound gently with mild soap and warm water. Reapply a new bandage after cleaning wound, if directed. . Continue daily cleansing with soap and water until stitches/staples are removed. . Do not apply any ointments or creams to the wound while stitches/staples are in place, as this may cause delayed healing. . Notify the office if you experience any of the following signs of infection: Swelling, redness, pus drainage, streaking, fever >101.0 F . Notify the office if you experience excessive bleeding that does not stop after 15-20 minutes of constant, firm pressure.

## 2014-10-18 NOTE — Telephone Encounter (Signed)
In the ED, they'll just lance it, too. She can come here, or go to the ED. It's her preference. Either way, she'll need to be referred to the general surgery office to have the surgery.

## 2014-10-18 NOTE — Progress Notes (Signed)
10/18/2014 at 6:16 PM  Kimberly Glass / DOB: [birth year] / MRN: [id number]  The patient has Essential hypertension, benign; Irregular menses; Dysmenorrhea; Female pelvic pain; Vulvar lesion; Endometriosis; Migraine; Anxiety state, unspecified; Obesity (BMI 30-39.9); and Pilonidal disease on her problem list.  SUBJECTIVE  Chief complaint: Recurrent Skin Infections   History of present illness: Kimberly Glass is 29 y.o. well appearing female presenting for reoccurring abscess in the lower back. She has pain in the area made worse with sitting as well as some nausea. She had a surgery scheduled for next Tuesday for cyst removal, but she thinks she has another infection today.  This has happened multiple times in the past, and has had to have the lesion lanced four times in the past. She has tried ibuprofen to help with her pain, with mild to moderate relief of her symptoms.  She has also been taking some "left over" doxycycline from a previous infection for roughly three days.     She  has a past medical history of Obesity, unspecified; Acne vulgaris; Allergic rhinitis, cause unspecified; Erythema nodosum; Essential hypertension, benign; Anxiety; Headache, migraine; Endometriosis (dx Jan 2014); and H/O pilonidal cyst.    She has a current medication list which includes the following prescription(s): alprazolam, levonorgestrel, lisinopril, multiple vitamins-minerals, venlafaxine xr, erythromycin base, ipratropium, and metronidazole, and the following Facility-Administered Medications: ceftriaxone and ondansetron.  Kimberly Glass is allergic to diphenhydramine; topiramate; yasmin; and penicillins. She  reports that she has never smoked. She has never used smokeless tobacco. She reports that she drinks about 0.6 oz of alcohol per week. She reports that she does not use illicit drugs. She  reports that she currently engages in sexual activity and has had female partners. She reports using the following method of  birth control/protection: IUD. The patient  has past surgical history that includes Wisdom tooth extraction.  Her family history includes Cancer in her maternal grandfather and mother; Diabetes in her maternal grandmother; Endometriosis in her maternal grandmother and mother; Heart disease in her paternal grandfather; Heart disease (age of onset: 45) in her father; Hyperlipidemia in her father and paternal grandfather; Hypertension in her father and paternal grandfather; Stroke in her paternal grandfather.  Review of Systems  Constitutional: Positive for fever. Negative for chills, malaise/fatigue and diaphoresis.  Respiratory: Negative for cough.   Cardiovascular: Negative for chest pain.  Gastrointestinal: Positive for nausea.  Genitourinary: Negative.   Musculoskeletal: Negative for myalgias.  Neurological: Negative for dizziness, weakness and headaches.    OBJECTIVE  Her  oral temperature is 98.1 F (36.7 C). Her blood pressure is 116/80 and her pulse is 114. Her respiration is 18 and oxygen saturation is 98%.  The patient's body mass index is unknown because there is no weight on file.  Physical Exam  Constitutional: She is oriented to person, place, and time. She appears well-developed and well-nourished.  Non-toxic appearance. She does not have a sickly appearance. She does not appear ill. No distress.  Cardiovascular: Regular rhythm.   No extrasystoles are present. Tachycardia present.   Respiratory: Effort normal and breath sounds normal.  GI: Soft. Bowel sounds are normal.  Neurological: She is alert and oriented to person, place, and time. No cranial nerve deficit.  Skin: Skin is warm and dry.     Psychiatric: She has a normal mood and affect.    Results for orders placed or performed in visit on 10/18/14 (from the past 24 hour(s))  POCT CBC     Status: Abnormal  Collection Time: 10/18/14  5:29 PM  Result Value Ref Range   WBC 15.8 (A) 4.6 - 10.2 K/uL   Lymph, poc 3.2  0.6 - 3.4   POC LYMPH PERCENT 20.5 10 - 50 %L   MID (cbc) 0.7 0 - 0.9   POC MID % 4.5 0 - 12 %M   POC Granulocyte 11.9 (A) 2 - 6.9   Granulocyte percent 75.0 37 - 80 %G   RBC 4.73 4.04 - 5.48 M/uL   Hemoglobin 13.8 12.2 - 16.2 g/dL   HCT, POC 16.143.0 09.637.7 - 47.9 %   MCV 90.9 80 - 97 fL   MCH, POC 29.2 27 - 31.2 pg   MCHC 32.2 31.8 - 35.4 g/dL   RDW, POC 04.513.3 %   Platelet Count, POC 406 142 - 424 K/uL   MPV 6.9 0 - 99.8 fL   Procedure: Risk and benefits discussed and verbal consent obtained. The patient was anesthetized using 6 cc of 2% lidocaine with epinephrine. A 2 cm incision was made with a number 11 and purulent material was expressed.  The wound was packed aggressively with 1/2 inch packing. The patient tolerated the procedure without difficulty.   A clean dressing was placed and wound care instructions were provided.   ASSESSMENT & PLAN  Kimberly Glass was seen today for recurrent skin infections.  Diagnoses and all orders for this visit:  Infected pilonidal cyst: Drained. Patient to RTC in 48 hours for wound care.  Orders: -     ondansetron (ZOFRAN-ODT) disintegrating tablet 8 mg; Take 2 tablets (8 mg total) by mouth once. -     POCT CBC -     erythromycin (E-MYCIN) 500 MG tablet; Take 1 tablet (500 mg total) by mouth 4 (four) times daily. -     metroNIDAZOLE (FLAGYL) 500 MG tablet; Take 1 tablet (500 mg total) by mouth 3 (three) times daily. -     cefTRIAXone (ROCEPHIN) injection 1 g; Inject 1 g into the muscle once.    The patient was advised to call or come back to clinic if she does not see an improvement in symptoms, or worsens with the above plan.   Deliah BostonMichael Derran Sear, MHS, PA-C [facility] 10/18/2014 6:16 PM

## 2014-10-20 ENCOUNTER — Ambulatory Visit (INDEPENDENT_AMBULATORY_CARE_PROVIDER_SITE_OTHER): Payer: BLUE CROSS/BLUE SHIELD | Admitting: Physician Assistant

## 2014-10-20 VITALS — BP 122/72 | HR 91 | Temp 98.2°F | Resp 16 | Ht 71.0 in | Wt 294.0 lb

## 2014-10-20 DIAGNOSIS — Z5189 Encounter for other specified aftercare: Secondary | ICD-10-CM

## 2014-10-20 NOTE — Telephone Encounter (Signed)
Spoke with pt, she was already seen for this.

## 2014-10-20 NOTE — Patient Instructions (Signed)
We placed packing in again to leave in place for 48 hours. Please take the packing out at that time.  Please continue to take your antibiotics until they're finished.    WOUND CARE . Keep area clean and dry for 24 hours. Do not remove bandage, if applied. . After 24 hours, remove bandage and wash wound gently with mild soap and warm water. Reapply a new bandage after cleaning wound, if directed. . Do not apply any ointments or creams to the wound while stitches/staples are in place, as this may cause delayed healing. . Notify the office if you experience any of the following signs of infection: Swelling, redness, pus drainage, streaking, fever >101.0 F . Notify the office if you experience excessive bleeding that does not stop after 15-20 minutes of constant, firm pressure.

## 2014-10-20 NOTE — Progress Notes (Signed)
  Medical screening examination/treatment/procedure(s) were performed by non-physician practitioner and as supervising physician I was immediately available for consultation/collaboration.     

## 2014-10-20 NOTE — Progress Notes (Signed)
   Subjective:    Patient ID: Kimberly Glass, female    DOB: 1985/09/19, 29 y.o.   MRN: [id number]  Chief Complaint  Patient presents with  . Follow-up  . Abscess   Patient Active Problem List   Diagnosis Date Noted  . Pilonidal disease 01/11/2014  . Anxiety state, unspecified 06/22/2013  . Obesity (BMI 30-39.9) 06/22/2013  . Migraine 05/10/2013  . Endometriosis   . Irregular menses 07/30/2012  . Dysmenorrhea 07/30/2012  . Female pelvic pain 07/30/2012  . Vulvar lesion 07/30/2012  . Essential hypertension, benign    Prior to Admission medications   Medication Sig Start Date End Date Taking? Authorizing Provider  ALPRAZolam Prudy Feeler(XANAX) 0.5 MG tablet Take 0.5-1 tablets (0.25-0.5 mg total) by mouth 3 (three) times daily as needed for anxiety. 08/08/14  Yes Chelle S Jeffery, PA-C  erythromycin (E-MYCIN) 250 MG tablet Take 1 tablet (250 mg total) by mouth 4 (four) times daily. 10/18/14 10/28/14 Yes Ofilia NeasMichael L Clark, PA-C  levonorgestrel (MIRENA) 20 MCG/24HR IUD 1 each by Intrauterine route once.   Yes Historical Provider, MD  lisinopril (PRINIVIL,ZESTRIL) 5 MG tablet Take 1 tablet (5 mg total) by mouth daily. 03/23/14  Yes Chelle S Jeffery, PA-C  metroNIDAZOLE (FLAGYL) 500 MG tablet Take 1 tablet (500 mg total) by mouth 3 (three) times daily. 10/18/14  Yes Ofilia NeasMichael L Clark, PA-C  Multiple Vitamins-Minerals (MULTIVITAMIN PO) Take by mouth.   Yes Historical Provider, MD  venlafaxine XR (EFFEXOR-XR) 75 MG 24 hr capsule Take 1 capsule (75 mg total) by mouth daily with breakfast. 03/23/14  Yes Chelle S Jeffery, PA-C  ipratropium (ATROVENT) 0.03 % nasal spray Place 2 sprays into both nostrils 2 (two) times daily. 06/28/14 07/05/14  Garnetta BuddyStephanie D English, PA   Medications, allergies, past medical history, surgical history, family history, social history and problem list reviewed and updated.  HPI  [age] yof returns for wound check.   Feels that she is doing well. Has been taking both abx as prescribed. Denies  fevers, chills. Has redressed the wound twice. States had good amnt pus the 1st day but minimal yest.   Review of Systems See HPI.     Objective:   Physical Exam  Constitutional: She appears well-developed and well-nourished.  Non-toxic appearance. She does not have a sickly appearance. She does not appear ill. No distress.  BP 122/72 mmHg  Pulse 91  Temp(Src) 98.2 F (36.8 C) (Oral)  Resp 16  Ht 5\' 11"  (1.803 m)  Wt 294 lb (133.358 kg)  BMI 41.02 kg/m2  SpO2 98%   Skin:  Incision site healing well. Packing removed with serosanguinous material. No surrounding erythema. Induration surrounding approx 2cm inferior and 2cm medial. No eschar visible. Good granulation tissue. Wound approx 2cm deep medially. Not able to express any pus with.       Assessment & Plan:   89 yof returns for wound check.   Visit for wound check --would healing well --no purulence, no surrounding erythema, mild surrounding induration --packing placed again as incision is only 0.5cm long and wound still 2cm deep to prevent premature closure --pt to take out dressing on own 48 hrs, no need to rtc unless pus increasing, pain increasing, fevers, chills, expanding erythema  Donnajean Lopesodd M. Khalfani Weideman, PA-C Physician Assistant-Certified [facility]  10/20/2014 1:15 PM

## 2014-10-28 ENCOUNTER — Emergency Department (HOSPITAL_COMMUNITY): Payer: BLUE CROSS/BLUE SHIELD | Admitting: Anesthesiology

## 2014-10-28 ENCOUNTER — Emergency Department (HOSPITAL_COMMUNITY): Payer: BLUE CROSS/BLUE SHIELD

## 2014-10-28 ENCOUNTER — Encounter (HOSPITAL_COMMUNITY)
Admission: EM | Disposition: A | Discharge status: Home / Self Care | Payer: Self-pay | Source: Home / Self Care | Attending: Emergency Medicine

## 2014-10-28 ENCOUNTER — Ambulatory Visit (HOSPITAL_COMMUNITY)
Admission: EM | Admit: 2014-10-28 | Discharge: 2014-10-29 | Disposition: A | Discharge status: Home / Self Care | Payer: BLUE CROSS/BLUE SHIELD | Attending: Emergency Medicine | Admitting: Emergency Medicine

## 2014-10-28 ENCOUNTER — Encounter (HOSPITAL_COMMUNITY): Payer: Self-pay | Admitting: Emergency Medicine

## 2014-10-28 DIAGNOSIS — E669 Obesity, unspecified: Secondary | ICD-10-CM | POA: Insufficient documentation

## 2014-10-28 DIAGNOSIS — Z6841 Body Mass Index (BMI) 40.0 and over, adult: Secondary | ICD-10-CM | POA: Diagnosis not present

## 2014-10-28 DIAGNOSIS — M25539 Pain in unspecified wrist: Secondary | ICD-10-CM

## 2014-10-28 DIAGNOSIS — S62031A Displaced fracture of proximal third of navicular [scaphoid] bone of right wrist, initial encounter for closed fracture: Secondary | ICD-10-CM | POA: Diagnosis not present

## 2014-10-28 DIAGNOSIS — S52121A Displaced fracture of head of right radius, initial encounter for closed fracture: Secondary | ICD-10-CM | POA: Diagnosis present

## 2014-10-28 DIAGNOSIS — S62001A Unspecified fracture of navicular [scaphoid] bone of right wrist, initial encounter for closed fracture: Secondary | ICD-10-CM | POA: Diagnosis present

## 2014-10-28 DIAGNOSIS — G8918 Other acute postprocedural pain: Secondary | ICD-10-CM | POA: Diagnosis not present

## 2014-10-28 DIAGNOSIS — S62394A Other fracture of fourth metacarpal bone, right hand, initial encounter for closed fracture: Secondary | ICD-10-CM | POA: Diagnosis not present

## 2014-10-28 DIAGNOSIS — S52301A Unspecified fracture of shaft of right radius, initial encounter for closed fracture: Secondary | ICD-10-CM | POA: Insufficient documentation

## 2014-10-28 DIAGNOSIS — I10 Essential (primary) hypertension: Secondary | ICD-10-CM | POA: Diagnosis not present

## 2014-10-28 DIAGNOSIS — L700 Acne vulgaris: Secondary | ICD-10-CM | POA: Insufficient documentation

## 2014-10-28 DIAGNOSIS — X58XXXA Exposure to other specified factors, initial encounter: Secondary | ICD-10-CM | POA: Diagnosis not present

## 2014-10-28 DIAGNOSIS — F419 Anxiety disorder, unspecified: Secondary | ICD-10-CM | POA: Diagnosis not present

## 2014-10-28 DIAGNOSIS — N946 Dysmenorrhea, unspecified: Secondary | ICD-10-CM | POA: Diagnosis not present

## 2014-10-28 DIAGNOSIS — G43909 Migraine, unspecified, not intractable, without status migrainosus: Secondary | ICD-10-CM | POA: Insufficient documentation

## 2014-10-28 DIAGNOSIS — S62304A Unspecified fracture of fourth metacarpal bone, right hand, initial encounter for closed fracture: Secondary | ICD-10-CM

## 2014-10-28 HISTORY — PX: ORIF RADIAL FRACTURE: SHX5113

## 2014-10-28 HISTORY — PX: OPEN REDUCTION INTERNAL FIXATION (ORIF) METACARPAL: SHX6234

## 2014-10-28 HISTORY — PX: ORIF SCAPHOID FRACTURE: SHX2130

## 2014-10-28 LAB — I-STAT CHEM 8, ED
BUN: 12 mg/dL (ref 6–23)
CALCIUM ION: 1.13 mmol/L (ref 1.12–1.23)
CREATININE: 0.6 mg/dL (ref 0.50–1.10)
Chloride: 104 mmol/L (ref 96–112)
Glucose, Bld: 132 mg/dL — ABNORMAL HIGH (ref 70–99)
HCT: 41 % (ref 36.0–46.0)
HEMOGLOBIN: 13.9 g/dL (ref 12.0–15.0)
Potassium: 4.3 mmol/L (ref 3.5–5.1)
Sodium: 137 mmol/L (ref 135–145)
TCO2: 20 mmol/L (ref 0–100)

## 2014-10-28 LAB — I-STAT BETA HCG BLOOD, ED (MC, WL, AP ONLY)

## 2014-10-28 SURGERY — OPEN REDUCTION INTERNAL FIXATION (ORIF) RADIAL FRACTURE
Anesthesia: Regional | Site: Arm Lower | Laterality: Right

## 2014-10-28 MED ORDER — CEFAZOLIN SODIUM-DEXTROSE 2-3 GM-% IV SOLR
INTRAVENOUS | Status: DC | PRN
  Administered 2014-10-28: 2 g via INTRAVENOUS

## 2014-10-28 MED ORDER — MIDAZOLAM HCL 2 MG/2ML IJ SOLN
INTRAMUSCULAR | Status: DC | PRN
  Administered 2014-10-28: 2 mg via INTRAVENOUS

## 2014-10-28 MED ORDER — ROCURONIUM BROMIDE 100 MG/10ML IV SOLN
INTRAVENOUS | Status: DC | PRN
  Administered 2014-10-28: 50 mg via INTRAVENOUS

## 2014-10-28 MED ORDER — FENTANYL CITRATE 0.05 MG/ML IJ SOLN
INTRAMUSCULAR | Status: DC | PRN
  Administered 2014-10-28: 50 ug via INTRAVENOUS
  Administered 2014-10-28: 100 ug via INTRAVENOUS
  Administered 2014-10-28: 50 ug via INTRAVENOUS

## 2014-10-28 MED ORDER — HYDROMORPHONE HCL 1 MG/ML IJ SOLN
0.5000 mg | Freq: Once | INTRAMUSCULAR | Status: AC
  Administered 2014-10-28: 0.5 mg via INTRAVENOUS
  Filled 2014-10-28: qty 1

## 2014-10-28 MED ORDER — LACTATED RINGERS IV SOLN
INTRAVENOUS | Status: DC | PRN
  Administered 2014-10-28 (×2): via INTRAVENOUS

## 2014-10-28 MED ORDER — SUCCINYLCHOLINE CHLORIDE 20 MG/ML IJ SOLN
INTRAMUSCULAR | Status: DC | PRN
  Administered 2014-10-28: 140 mg via INTRAVENOUS

## 2014-10-28 MED ORDER — ONDANSETRON HCL 4 MG/2ML IJ SOLN
4.0000 mg | Freq: Once | INTRAMUSCULAR | Status: AC
  Administered 2014-10-28: 4 mg via INTRAVENOUS
  Filled 2014-10-28: qty 2

## 2014-10-28 MED ORDER — LIDOCAINE HCL (CARDIAC) 20 MG/ML IV SOLN
INTRAVENOUS | Status: DC | PRN
  Administered 2014-10-28: 30 mg via INTRAVENOUS

## 2014-10-28 MED ORDER — PROPOFOL 10 MG/ML IV BOLUS
INTRAVENOUS | Status: DC | PRN
  Administered 2014-10-28: 50 mg via INTRAVENOUS
  Administered 2014-10-28: 150 mg via INTRAVENOUS

## 2014-10-28 SURGICAL SUPPLY — 77 items
APL SKNCLS STERI-STRIP NONHPOA (GAUZE/BANDAGES/DRESSINGS) ×1
BANDAGE ELASTIC 3 VELCRO ST LF (GAUZE/BANDAGES/DRESSINGS) ×2 IMPLANT
BANDAGE ELASTIC 4 VELCRO ST LF (GAUZE/BANDAGES/DRESSINGS) ×2 IMPLANT
BENZOIN TINCTURE PRP APPL 2/3 (GAUZE/BANDAGES/DRESSINGS) ×1 IMPLANT
BIT DRILL 1.1 (BIT) ×2
BIT DRILL 2.5X2.75 QC CALB (BIT) ×1 IMPLANT
BIT DRILL 60X20X1.1XQC TMX (BIT) IMPLANT
BIT DRILL CANN 1.6 BIODRIVE (BIT) ×1 IMPLANT
BIT DRL 60X20X1.1XQC TMX (BIT) ×1
BLADE SURG 15 STRL LF DISP TIS (BLADE) IMPLANT
BLADE SURG 15 STRL SS (BLADE) ×2
BNDG CMPR 9X4 STRL LF SNTH (GAUZE/BANDAGES/DRESSINGS) ×1
BNDG COHESIVE 3X5 TAN STRL LF (GAUZE/BANDAGES/DRESSINGS) ×1 IMPLANT
BNDG CONFORM 2 STRL LF (GAUZE/BANDAGES/DRESSINGS) ×1 IMPLANT
BNDG ESMARK 4X9 LF (GAUZE/BANDAGES/DRESSINGS) ×2 IMPLANT
BNDG GAUZE ELAST 4 BULKY (GAUZE/BANDAGES/DRESSINGS) ×3 IMPLANT
CLSR STERI-STRIP ANTIMIC 1/2X4 (GAUZE/BANDAGES/DRESSINGS) IMPLANT
CORDS BIPOLAR (ELECTRODE) ×1 IMPLANT
COVER SURGICAL LIGHT HANDLE (MISCELLANEOUS) ×2 IMPLANT
CUFF TOURNIQUET SINGLE 18IN (TOURNIQUET CUFF) ×2 IMPLANT
DRAPE OEC MINIVIEW 54X84 (DRAPES) ×1 IMPLANT
DRAPE SURG 17X23 STRL (DRAPES) ×2 IMPLANT
DRIVER BIT 1.5 (TRAUMA) ×1 IMPLANT
DURAPREP 26ML APPLICATOR (WOUND CARE) ×2 IMPLANT
ELECT REM PT RETURN 9FT ADLT (ELECTROSURGICAL)
ELECTRODE REM PT RTRN 9FT ADLT (ELECTROSURGICAL) IMPLANT
GAUZE SPONGE 4X4 12PLY STRL (GAUZE/BANDAGES/DRESSINGS) ×3 IMPLANT
GAUZE XEROFORM 1X8 LF (GAUZE/BANDAGES/DRESSINGS) ×3 IMPLANT
GLOVE BIOGEL PI IND STRL 6.5 (GLOVE) IMPLANT
GLOVE BIOGEL PI INDICATOR 6.5 (GLOVE) ×1
GLOVE ECLIPSE 6.5 STRL STRAW (GLOVE) ×1 IMPLANT
GLOVE SURG SS PI 7.0 STRL IVOR (GLOVE) ×1 IMPLANT
GLOVE SURG SYN 8.0 (GLOVE) ×2 IMPLANT
GLOVE SURG SYN 8.0 PF PI (GLOVE) ×1 IMPLANT
GOWN STRL REUS W/ TWL LRG LVL3 (GOWN DISPOSABLE) ×1 IMPLANT
GOWN STRL REUS W/ TWL XL LVL3 (GOWN DISPOSABLE) ×1 IMPLANT
GOWN STRL REUS W/TWL LRG LVL3 (GOWN DISPOSABLE) ×2
GOWN STRL REUS W/TWL XL LVL3 (GOWN DISPOSABLE) ×4
K-WIRE .8 (WIRE) ×1 IMPLANT
KIT BASIN OR (CUSTOM PROCEDURE TRAY) ×2 IMPLANT
KIT ROOM TURNOVER OR (KITS) ×2 IMPLANT
MANIFOLD NEPTUNE II (INSTRUMENTS) ×1 IMPLANT
NDL HYPO 25GX1X1/2 BEV (NEEDLE) IMPLANT
NEEDLE HYPO 25GX1X1/2 BEV (NEEDLE) IMPLANT
NS IRRIG 1000ML POUR BTL (IV SOLUTION) ×2 IMPLANT
PACK ORTHO EXTREMITY (CUSTOM PROCEDURE TRAY) ×2 IMPLANT
PAD ARMBOARD 7.5X6 YLW CONV (MISCELLANEOUS) ×4 IMPLANT
PAD CAST 3X4 CTTN HI CHSV (CAST SUPPLIES) ×1 IMPLANT
PAD CAST 4YDX4 CTTN HI CHSV (CAST SUPPLIES) ×1 IMPLANT
PADDING CAST ABS 4INX4YD NS (CAST SUPPLIES) ×1
PADDING CAST ABS COTTON 4X4 ST (CAST SUPPLIES) IMPLANT
PADDING CAST COTTON 3X4 STRL (CAST SUPPLIES) ×2
PADDING CAST COTTON 4X4 STRL (CAST SUPPLIES) ×2
PENCIL BUTTON HOLSTER BLD 10FT (ELECTRODE) IMPLANT
PLATE LOCK COMP 5H FOOT (Plate) ×1 IMPLANT
SCREW 1.3X10MM (Screw) ×4 IMPLANT
SCREW 1.3X9MM (Screw) ×4 IMPLANT
SCREW BIODRIVE MICRO 2.3X12 (Screw) ×1 IMPLANT
SCREW BN 10X1.3XNONLOCK HND (Screw) IMPLANT
SCREW BN 9X1.3XST NONLOCK (Screw) IMPLANT
SCREW CORT 3.5X16 815037016 (Screw) ×2 IMPLANT
SCREW CORTICAL 3.5MM 14MM (Screw) ×4 IMPLANT
SCREW NON LOCK 1.3X13MM (Screw) ×1 IMPLANT
SPLINT FIBERGLASS 4X15 (CAST SUPPLIES) ×1 IMPLANT
SPONGE GAUZE 4X4 12PLY STER LF (GAUZE/BANDAGES/DRESSINGS) ×1 IMPLANT
SPONGE LAP 4X18 X RAY DECT (DISPOSABLE) IMPLANT
STRIP CLOSURE SKIN 1/2X4 (GAUZE/BANDAGES/DRESSINGS) ×1 IMPLANT
SUT PROLENE 3 0 PS 2 (SUTURE) ×2 IMPLANT
SUT VIC AB 0 CT2 27 (SUTURE) ×1 IMPLANT
SUT VIC AB 2-0 FS1 27 (SUTURE) ×2 IMPLANT
SUT VICRYL 4-0 PS2 18IN ABS (SUTURE) IMPLANT
SYR CONTROL 10ML LL (SYRINGE) IMPLANT
TOWEL OR 17X24 6PK STRL BLUE (TOWEL DISPOSABLE) ×2 IMPLANT
TOWEL OR 17X26 10 PK STRL BLUE (TOWEL DISPOSABLE) ×2 IMPLANT
TUBE CONNECTING 12X1/4 (SUCTIONS) IMPLANT
UNDERPAD 30X30 INCONTINENT (UNDERPADS AND DIAPERS) ×2 IMPLANT
WATER STERILE IRR 1000ML POUR (IV SOLUTION) ×1 IMPLANT
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
## 2014-10-28 NOTE — ED Notes (Signed)
Called to give report to charge nurse Elliot GurneyWoody on patient.

## 2014-10-28 NOTE — Anesthesia Preprocedure Evaluation (Addendum)
Anesthesia Evaluation  Patient identified by MRN, date of birth, ID band Patient awake    Reviewed: Allergy & Precautions, NPO status , Patient's Chart, lab work & pertinent test results  Airway Mallampati: I  TM Distance: >3 FB Neck ROM: Full    Dental  (+) Teeth Intact, Dental Advisory Given   Pulmonary  breath sounds clear to auscultation        Cardiovascular hypertension, Pt. on medications Rhythm:Regular Rate:Tachycardia     Neuro/Psych Anxiety    GI/Hepatic   Endo/Other    Renal/GU      Musculoskeletal   Abdominal (+) + obese,   Peds  Hematology   Anesthesia Other Findings   Reproductive/Obstetrics                            Anesthesia Physical Anesthesia Plan  ASA: II  Anesthesia Plan: General and Regional   Post-op Pain Management:    Induction: Intravenous  Airway Management Planned:   Additional Equipment:   Intra-op Plan:   Post-operative Plan: Extubation in OR  Informed Consent: I have reviewed the patients History and Physical, chart, labs and discussed the procedure including the risks, benefits and alternatives for the proposed anesthesia with the patient or authorized representative who has indicated his/her understanding and acceptance.   Dental advisory given  Plan Discussed with: CRNA, Anesthesiologist and Surgeon  Anesthesia Plan Comments: (Plan GA with oral ETT and supraclavicular block)       Anesthesia Quick Evaluation

## 2014-10-28 NOTE — Progress Notes (Signed)
Pre-op assessment completed also by M. Johntae Broxterman, RN at this time.

## 2014-10-28 NOTE — Discharge Instructions (Signed)
Go [facility], you will go through the emergency department and go straight to the operating room.
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
## 2014-10-28 NOTE — ED Notes (Signed)
Pt is undressed, in a gown, getting vitals, flushed IV, and sending to OR at this time per request from OR.

## 2014-10-28 NOTE — ED Notes (Signed)
Called OR, states they are ready and to bring pt to Short Stay 36.

## 2014-10-28 NOTE — Anesthesia Procedure Notes (Addendum)
Anesthesia Regional Block:  Supraclavicular block  Pre-Anesthetic Checklist: ,, timeout performed, Correct Patient, Correct Site, Correct Laterality, Correct Procedure, Correct Position, site marked, Risks and benefits discussed,  Surgical consent,  Pre-op evaluation,  At surgeon's request and post-op pain management  Laterality: Right  Prep: chloraprep       Needles:  Injection technique: Single-shot  Needle Type: Echogenic Stimulator Needle     Needle Length: 9cm 9 cm Needle Gauge: 22 and 22 G    Additional Needles:  Procedures: ultrasound guided (picture in chart) Supraclavicular block Narrative:  Start time: 10/28/2014 10:05 PM End time: 10/28/2014 10:10 PM Injection made incrementally with aspirations every 5 mL.  Performed by: Personally   Additional Notes: 25 cc 0.5% Marcaine 1:200 Epi injected easily   Procedure Name: Intubation Date/Time: 10/28/2014 10:24 PM Performed by: Molli HazardGORDON, Georgiana Spillane M Pre-anesthesia Checklist: Patient identified, Emergency Drugs available, Suction available and Patient being monitored Patient Re-evaluated:Patient Re-evaluated prior to inductionOxygen Delivery Method: Circle system utilized Preoxygenation: Pre-oxygenation with 100% oxygen Intubation Type: IV induction, Rapid sequence and Cricoid Pressure applied Laryngoscope Size: Miller and 2 Grade View: Grade I Tube type: Oral Tube size: 7.5 mm Number of attempts: 1 Airway Equipment and Method: Stylet Placement Confirmation: ETT inserted through vocal cords under direct vision,  positive ETCO2 and breath sounds checked- equal and bilateral Secured at: 21 cm Tube secured with: Tape Dental Injury: Teeth and Oropharynx as per pre-operative assessment

## 2014-10-28 NOTE — ED Notes (Signed)
Patient transported to X-ray 

## 2014-10-28 NOTE — ED Notes (Signed)
[facility]. PA requested iv to be wrapped up and [facility]. [facility] put in [facility].
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
## 2014-10-29 ENCOUNTER — Emergency Department (HOSPITAL_COMMUNITY)
Admission: EM | Admit: 2014-10-29 | Discharge: 2014-10-29 | Disposition: A | Discharge status: Home / Self Care | Payer: BLUE CROSS/BLUE SHIELD | Attending: Emergency Medicine | Admitting: Emergency Medicine

## 2014-10-29 ENCOUNTER — Encounter (HOSPITAL_COMMUNITY): Payer: Self-pay

## 2014-10-29 DIAGNOSIS — Z792 Long term (current) use of antibiotics: Secondary | ICD-10-CM | POA: Insufficient documentation

## 2014-10-29 DIAGNOSIS — Z8781 Personal history of (healed) traumatic fracture: Secondary | ICD-10-CM | POA: Diagnosis not present

## 2014-10-29 DIAGNOSIS — Z8709 Personal history of other diseases of the respiratory system: Secondary | ICD-10-CM | POA: Insufficient documentation

## 2014-10-29 DIAGNOSIS — G8918 Other acute postprocedural pain: Secondary | ICD-10-CM | POA: Insufficient documentation

## 2014-10-29 DIAGNOSIS — I10 Essential (primary) hypertension: Secondary | ICD-10-CM | POA: Diagnosis not present

## 2014-10-29 DIAGNOSIS — F419 Anxiety disorder, unspecified: Secondary | ICD-10-CM | POA: Insufficient documentation

## 2014-10-29 DIAGNOSIS — E669 Obesity, unspecified: Secondary | ICD-10-CM | POA: Insufficient documentation

## 2014-10-29 DIAGNOSIS — G43909 Migraine, unspecified, not intractable, without status migrainosus: Secondary | ICD-10-CM | POA: Diagnosis not present

## 2014-10-29 DIAGNOSIS — Z79899 Other long term (current) drug therapy: Secondary | ICD-10-CM | POA: Insufficient documentation

## 2014-10-29 DIAGNOSIS — Z880 Allergy status to penicillin: Secondary | ICD-10-CM | POA: Diagnosis not present

## 2014-10-29 DIAGNOSIS — Z8742 Personal history of other diseases of the female genital tract: Secondary | ICD-10-CM | POA: Insufficient documentation

## 2014-10-29 DIAGNOSIS — S52301A Unspecified fracture of shaft of right radius, initial encounter for closed fracture: Secondary | ICD-10-CM | POA: Diagnosis not present

## 2014-10-29 MED ORDER — FENTANYL CITRATE 0.05 MG/ML IJ SOLN
50.0000 ug | Freq: Once | INTRAMUSCULAR | Status: AC
  Administered 2014-10-29: 50 ug via NASAL
  Filled 2014-10-29: qty 2

## 2014-10-29 MED ORDER — KETOROLAC TROMETHAMINE 60 MG/2ML IM SOLN
60.0000 mg | Freq: Once | INTRAMUSCULAR | Status: AC
  Administered 2014-10-29: 60 mg via INTRAMUSCULAR
  Filled 2014-10-29: qty 2

## 2014-10-29 MED ORDER — OXYCODONE HCL 5 MG PO TABS: 5.0000 mg | ORAL_TABLET | Freq: Once | ORAL | Status: DC | PRN

## 2014-10-29 MED ORDER — ONDANSETRON HCL 4 MG/2ML IJ SOLN
4.0000 mg | Freq: Once | INTRAMUSCULAR | Status: AC | PRN
  Administered 2014-10-29: 4 mg via INTRAVENOUS

## 2014-10-29 MED ORDER — KETOROLAC TROMETHAMINE 30 MG/ML IJ SOLN
INTRAMUSCULAR | Status: AC
  Filled 2014-10-29: qty 1

## 2014-10-29 MED ORDER — OXYCODONE-ACETAMINOPHEN 5-325 MG PO TABS
1.0000 | ORAL_TABLET | Freq: Once | ORAL | Status: AC
  Administered 2014-10-29: 1 via ORAL
  Filled 2014-10-29: qty 1

## 2014-10-29 MED ORDER — NEOSTIGMINE METHYLSULFATE 10 MG/10ML IV SOLN
INTRAVENOUS | Status: DC | PRN
  Administered 2014-10-29: 4 mg via INTRAVENOUS

## 2014-10-29 MED ORDER — GLYCOPYRROLATE 0.2 MG/ML IJ SOLN
INTRAMUSCULAR | Status: DC | PRN
  Administered 2014-10-29: 0.6 mg via INTRAVENOUS

## 2014-10-29 MED ORDER — HYDROMORPHONE HCL 1 MG/ML IJ SOLN
INTRAMUSCULAR | Status: AC
  Filled 2014-10-29: qty 1

## 2014-10-29 MED ORDER — ONDANSETRON HCL 4 MG/2ML IJ SOLN
INTRAMUSCULAR | Status: AC
  Filled 2014-10-29: qty 2

## 2014-10-29 MED ORDER — 0.9 % SODIUM CHLORIDE (POUR BTL) OPTIME
TOPICAL | Status: DC | PRN
  Administered 2014-10-29: 1000 mL

## 2014-10-29 MED ORDER — KETOROLAC TROMETHAMINE 30 MG/ML IJ SOLN
30.0000 mg | Freq: Once | INTRAMUSCULAR | Status: AC | PRN
Start: 2014-10-29 — End: 2014-10-29
  Administered 2014-10-29: 30 mg via INTRAVENOUS

## 2014-10-29 MED ORDER — DEXAMETHASONE SODIUM PHOSPHATE 10 MG/ML IJ SOLN
INTRAMUSCULAR | Status: AC
  Administered 2014-10-29: 8 mg via INTRAVENOUS
  Filled 2014-10-29: qty 1

## 2014-10-29 MED ORDER — OXYCODONE HCL 5 MG/5ML PO SOLN: 5.0000 mg | Freq: Once | ORAL | Status: DC | PRN

## 2014-10-29 MED ORDER — FENTANYL CITRATE 0.05 MG/ML IJ SOLN
100.0000 ug | Freq: Once | INTRAMUSCULAR | Status: AC
  Administered 2014-10-29: 100 ug via NASAL
  Filled 2014-10-29: qty 2

## 2014-10-29 MED ORDER — DEXAMETHASONE SODIUM PHOSPHATE 10 MG/ML IJ SOLN
8.0000 mg | Freq: Once | INTRAMUSCULAR | Status: AC
Start: 2014-10-29 — End: 2014-10-29
  Administered 2014-10-29: 8 mg via INTRAVENOUS

## 2014-10-29 MED ORDER — OXYCODONE-ACETAMINOPHEN 5-325 MG PO TABS: 1.0000 | ORAL_TABLET | ORAL | Status: DC | PRN

## 2014-10-29 MED ORDER — HYDROMORPHONE HCL 1 MG/ML IJ SOLN
0.2500 mg | INTRAMUSCULAR | Status: DC | PRN
  Administered 2014-10-29: 0.25 mg via INTRAVENOUS

## 2014-10-29 MED ORDER — METOCLOPRAMIDE HCL 5 MG/ML IJ SOLN
10.0000 mg | Freq: Once | INTRAMUSCULAR | Status: AC
  Administered 2014-10-29: 10 mg via INTRAVENOUS

## 2014-10-29 MED ORDER — METOCLOPRAMIDE HCL 5 MG/ML IJ SOLN
INTRAMUSCULAR | Status: AC
  Filled 2014-10-29: qty 2

## 2014-10-29 MED ORDER — ONDANSETRON HCL 4 MG/2ML IJ SOLN
INTRAMUSCULAR | Status: DC | PRN
  Administered 2014-10-29: 4 mg via INTRAVENOUS

## 2014-10-29 NOTE — Discharge Instructions (Signed)
Let Dr Mina MarbleWeingold know if you aren't improving this morning. Take the percocet for pain. Elevate your hand. Use ice packs as instructed.

## 2014-10-29 NOTE — Anesthesia Postprocedure Evaluation (Signed)
  Anesthesia Post-op Note  Patient: Kimberly Glass  Procedure(s) Performed: Procedure(s): OPEN REDUCTION INTERNAL FIXATION RIGHT RADIUS FRACTURE (Right) OPEN REDUCTION INTERNAL FIXATION RIGHT FINGER METACARPAL FRACTURE (Right) OPEN REDUCTION INTERNAL FIXATION (ORIF) SCAPHOID FRACTURE (Right)  Patient Location: PACU  Anesthesia Type:General  Level of Consciousness: awake, alert  and oriented  Airway and Oxygen Therapy: Patient Spontanous Breathing  Post-op Pain: none  Post-op Assessment: Post-op Vital signs reviewed, Patient's Cardiovascular Status Stable, Respiratory Function Stable, Patent Airway and Pain level controlled  Post-op Vital Signs: stable  Last Vitals:  Filed Vitals:   10/29/14 0230  BP: 133/81  Pulse: 99  Temp:   Resp: 14    Complications: No apparent anesthesia complications

## 2014-10-29 NOTE — Op Note (Signed)
See note [phone] [id number]

## 2014-10-29 NOTE — Op Note (Signed)
Kimberly Memos:  Glass, Kimberly Glass               ACCOUNT NO.:  [id number]  MEDICAL RECORD NO.:  [id number]  LOCATION:  [facility]                         FACILITY:  [facility]  PHYSICIAN:  Artist PaisMatthew A. Jeremias Broyhill, M.D.DATE OF BIRTH:  11-Jan-1986  DATE OF PROCEDURE:  10/28/2014 DATE OF DISCHARGE:  10/29/2014                              OPERATIVE REPORT   PREOPERATIVE DIAGNOSES: 1. Displaced right midshaft radius fracture. 2. Proximal pole scaphoid fracture. 3. Displaced comminuted right ring metacarpal fracture.  POSTOPERATIVE DIAGNOSES: 1. Displaced right midshaft radius fracture. 2. Proximal pole scaphoid fracture. 3. Displaced comminuted right ring metacarpal fracture.  PROCEDURES: 1. Open reduction and internal fixation of midshaft radius fracture     with 5-hole compression plate, volar approach. 2. ORIF proximal pole scaphoid fracture with headless Biomet 2.8 mm     compression screw. 3. ORIF comminuted fracture ring metacarpal, right hand with 1.3 mm     lag screws, the cerclage suture.  SURGEON:  Artist PaisMatthew A. Mina MarbleWeingold, M.D.  ASSISTANT:  None.  ANESTHESIA:  Supraclavicular block and general.  TOURNIQUET TIME:  2 hours.  COMPLICATION:  No complication.  DRAINS:  No drains.  DESCRIPTION OF PROCEDURE:  The patient was taken to the operating suite. After induction of general anesthetic, right upper extremity was prepped and draped in sterile fashion.  An Esmarch was used to exsanguinate the limb.  Tourniquet was then inflated to 275 mmHg.  At this point, an incision was made over the volar aspect of the forearm area and midshaft area.  Skin was incised sharply.  Dissection was carried down between the radial artery and the FCR.  Dissection was carried down to the fracture site.  With careful dissection, the fracture site revealed a bayonet apposition fracture with volar displacement of the distal fragment.  We were used reduction clamps to reduce the fracture.  I then contoured a 5-hole plate  with 2 cortical screws above, 2 cortical screws below the fracture site, one at the fracture site.  This was placed under direct and fluoroscopic guidance.  Wound was thoroughly irrigated and loosely closed in layers of 2-0 undyed Vicryl and a 3-0 Prolene subcuticular stitch on the skin.  Second incision was made over the dorsal aspect of the wrist reflex.  We flexed the wrist down, pronated it to palpate the proximal pole of the scaphoid using the Biomet 2.8 mm headless screws set.  We placed a guidewire across the fracture site and proximal pole of the waist and distal pole area, and then under direct and fluoroscopic guidance, placed 12 mm screw to fix the proximal scaphoid fracture.  The wound was irrigated and loosely closed with a 3- 0 Prolene subcuticular stitch.  Third incision was made over the metacarpal of the ring finger.  Skin was incised.  The EDC tendon to the ring metacarpal was retracted only.  Subperiosteal dissection of the fracture site was undertaken, revealed a complex spiral oblique fracture with a large butterfly fragment.  We carefully fixed distally first from the distal fragment to the proximal fragment with a single 1.2 mm x 9 mm lag screw.  We then reduced the comminuted butterfly fragment to the main shaft and fixed this with 3  more lag screws from dorsal to volar and from ulnar to radial.  Finally, we placed two 0 Vicryl cerclage sutures around the shaft area to help capture comminution. Intraoperative fluoroscopy revealed adequate reduction in AP, lateral, and oblique view.  The wound was irrigated and loosely closed in layers of 2-0 undyed Vicryl to cover the hardware and a 3-0 Prolene subcuticular stitch on the skin.  Steri-Strips, 4x4s, fluffs, and compressive bandage were applied as well as volar splint.  The patient tolerated all procedures well, went to the recovery room in stable fashion.     Artist Pais Mina Marble, M.D.     MAW/MEDQ  D:   10/29/2014  T:  10/29/2014  Job:  [id number]

## 2014-10-29 NOTE — ED Notes (Signed)
Pt just left [facility] after having ORIF of right wrist/hand, sent home with prescriptions for pain meds but was unable to get them filled at the 24 hour pharmacy in [place] due to "met the quota for narcotics for the night"   Pt states the nerve block that was supposed to last the rest of the night has worn off and she is in severe pain

## 2014-10-29 NOTE — Transfer of Care (Signed)
Immediate Anesthesia Transfer of Care Note  Patient: Kimberly Glass  Procedure(s) Performed: Procedure(s): OPEN REDUCTION INTERNAL FIXATION RIGHT RADIUS FRACTURE (Right) OPEN REDUCTION INTERNAL FIXATION RIGHT FINGER METACARPAL FRACTURE (Right) OPEN REDUCTION INTERNAL FIXATION (ORIF) SCAPHOID FRACTURE (Right)  Patient Location: PACU  Anesthesia Type:GA combined with regional for post-op pain  Level of Consciousness: awake, alert  and oriented  Airway & Oxygen Therapy: Patient connected to nasal cannula oxygen  Post-op Assessment: Report given to RN, Post -op Vital signs reviewed and stable and Patient moving all extremities X 4  Post vital signs: Reviewed and stable  Last Vitals:  Filed Vitals:   10/28/14 2137  BP: 125/66  Pulse: 117  Temp: 36.9 C  Resp: 18    Complications: No apparent anesthesia complications
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
## 2014-10-30 ENCOUNTER — Encounter: Payer: Self-pay | Admitting: Physician Assistant

## 2014-10-31 ENCOUNTER — Encounter (HOSPITAL_COMMUNITY): Payer: Self-pay | Admitting: Orthopedic Surgery

## 2014-10-31 NOTE — Progress Notes (Signed)
Reviewed documentation and agree w/ assessment and plan. Eva Shaw, MD MPH 

## 2014-12-14 ENCOUNTER — Other Ambulatory Visit: Payer: Self-pay | Admitting: Surgery

## 2014-12-14 NOTE — H&P (Signed)
Kimberly Glass 12/14/2014 12:50 PM Location: [facility] Patient #: [id number] DOB: 06-27-1986 Single / Language: [place] / Race: White Female History of Present Illness Ardeth Sportsman MD; 12/14/2014 1:50 PM) The patient is a 29 year old female who presents with a pilonidal cyst. Patient returns to consider surgery for pilonidal disease.  Pleasant morbidly obese woman. History of skin infections on tailbone. Has had incision and drainage done numerous times. I saw her last June. Diagnosid her with pilonidal disease. Upper intergluteal cleft. I recommended surgery to excise the chronic scar to avoid further attacks. This did not happen. She claims that her life got busy. She had another abscess drained in the urgent care center in March 2016. Required IV and oral antibiotics. Had another episode of pain that she took some doxycycline to keep at bay. She is now interested in surgery. She moves her bowels every day. Usually the flares happen in the warmer times of year. Other Problems Ethlyn Gallery, CMA; 12/14/2014 12:50 PM) Anxiety Disorder Back Pain  Diagnostic Studies History Ethlyn Gallery, CMA; 12/14/2014 12:50 PM) Colonoscopy never Mammogram never Pap Smear 1-5 years ago  Allergies Ethlyn Gallery, CMA; 12/14/2014 12:52 PM) DiphenhydrAMINE HCl (Sleep) *HYPNOTICS/SEDATIVES/SLEEP DISORDER AGENTS* Topiramate *CHEMICALS* Yasmin 28 *CONTRACEPTIVES* Penicillin G Pot in Dextrose *PENICILLINS*  Medication History (Alisha Spillers, CMA; 12/14/2014 12:53 PM) ALPRAZolam (0.5MG  Tablet, Oral) Active. Lisinopril (  Tablet, Oral) Active. Venlafaxine HCl ER (  Capsule ER 24HR, Oral) Active. Mirena (20MCG/24HR IUD, Intrauterine) Active. Multivitamins (Oral) Active. Medications Reconciled  Social History Ethlyn Gallery, CMA; 12/14/2014 12:50 PM) Alcohol use Occasional alcohol use. Caffeine use Tea. No drug use Tobacco use Never  smoker.  Family History Ethlyn Gallery, CMA; 12/14/2014 12:50 PM) Arthritis Father. Cancer Mother. Heart Disease Father. Heart disease in female family member before age 55 Hypertension Father.  Pregnancy / Birth History Ethlyn Gallery, CMA; 12/14/2014 12:50 PM) Age at menarche 11 years. Contraceptive History Intrauterine device. Gravida 0 Para 0 Regular periods     Review of Systems (Alisha Spillers CMA; 12/14/2014 12:50 PM) General Not Present- Appetite Loss, Chills, Fatigue, Fever, Night Sweats, Weight Gain and Weight Loss. Skin Not Present- Change in Wart/Mole, Dryness, Hives, Jaundice, New Lesions, Non-Healing Wounds, Rash and Ulcer. HEENT Present- Seasonal Allergies. Not Present- Earache, Hearing Loss, Hoarseness, Nose Bleed, Oral Ulcers, Ringing in the Ears, Sinus Pain, Sore Throat, Visual Disturbances, Wears glasses/contact lenses and Yellow Eyes. Respiratory Present- Snoring. Not Present- Bloody sputum, Chronic Cough, Difficulty Breathing and Wheezing. Breast Not Present- Breast Mass, Breast Pain, Nipple Discharge and Skin Changes. Cardiovascular Not Present- Chest Pain, Difficulty Breathing Lying Down, Leg Cramps, Palpitations, Rapid Heart Rate, Shortness of Breath and Swelling of Extremities. Gastrointestinal Not Present- Abdominal Pain, Bloating, Bloody Stool, Change in Bowel Habits, Chronic diarrhea, Constipation, Difficulty Swallowing, Excessive gas, Gets full quickly at meals, Hemorrhoids, Indigestion, Nausea, Rectal Pain and Vomiting. Female Genitourinary Not Present- Frequency, Nocturia, Painful Urination, Pelvic Pain and Urgency. Musculoskeletal Not Present- Back Pain, Joint Pain, Joint Stiffness, Muscle Pain, Muscle Weakness and Swelling of Extremities. Neurological Not Present- Decreased Memory, Fainting, Headaches, Numbness, Seizures, Tingling, Tremor, Trouble walking and Weakness. Psychiatric Not Present- Anxiety, Bipolar, Change in Sleep Pattern,  Depression, Fearful and Frequent crying. Endocrine Not Present- Cold Intolerance, Excessive Hunger, Hair Changes, Heat Intolerance, Hot flashes and New Diabetes. Hematology Not Present- Easy Bruising, Excessive bleeding, Gland problems, HIV and Persistent Infections.  Vitals (Alisha Spillers CMA; 12/14/2014 12:51 PM) 12/14/2014 12:51 PM Weight: 299 lb Height: 69in Body Surface Area: 2.57 m Body Mass  Index: 44.15 kg/m Pulse: 80 (Regular)  BP: 118/70 (Sitting, Left Arm, Standard)     Physical Exam Ardeth Sportsman MD; 12/14/2014 1:43 PM)  General Mental Status-Alert. General Appearance-Not in acute distress, Not Sickly. Orientation-Oriented X3. Hydration-Well hydrated. Voice-Normal.  Integumentary Global Assessment Upon inspection and palpation of skin surfaces of the - Axillae: non-tender, no inflammation or ulceration, no drainage. and Distribution of scalp and body hair is normal. General Characteristics Temperature - normal warmth is noted.  Head and Neck Head-normocephalic, atraumatic with no lesions or palpable masses. Face Global Assessment - atraumatic, no absence of expression. Neck Global Assessment - no abnormal movements, no bruit auscultated on the right, no bruit auscultated on the left, no decreased range of motion, non-tender. Trachea-midline. Thyroid Gland Characteristics - non-tender.  Eye Eyeball - Left-Extraocular movements intact, No Nystagmus. Eyeball - Right-Extraocular movements intact, No Nystagmus. Cornea - Left-No Hazy. Cornea - Right-No Hazy. Sclera/Conjunctiva - Left-No scleral icterus, No Discharge. Sclera/Conjunctiva - Right-No scleral icterus, No Discharge. Pupil - Left-Direct reaction to light normal. Pupil - Right-Direct reaction to light normal.  ENMT Ears Pinna - Left - no drainage observed, no generalized tenderness observed. Right - no drainage observed, no generalized tenderness  observed. Nose and Sinuses External Inspection of the Nose - no destructive lesion observed. Inspection of the nares - Left - quiet respiration. Right - quiet respiration. Mouth and Throat Lips - Upper Lip - no fissures observed, no pallor noted. Lower Lip - no fissures observed, no pallor noted. Nasopharynx - no discharge present. Oral Cavity/Oropharynx - Tongue - no dryness observed. Oral Mucosa - no cyanosis observed. Hypopharynx - no evidence of airway distress observed.  Chest and Lung Exam Inspection Movements - Normal and Symmetrical. Accessory muscles - No use of accessory muscles in breathing. Palpation Palpation of the chest reveals - Non-tender. Auscultation Breath sounds - Normal and Clear.  Cardiovascular Auscultation Rhythm - Regular. Murmurs & Other Heart Sounds - Auscultation of the heart reveals - No Murmurs and No Systolic Clicks.  Abdomen Inspection Inspection of the abdomen reveals - No Visible peristalsis and No Abnormal pulsations. Umbilicus - No Bleeding, No Urine drainage. Palpation/Percussion Palpation and Percussion of the abdomen reveal - Soft, Non Tender, No Rebound tenderness, No Rigidity (guarding) and No Cutaneous hyperesthesia. Note: Morbidly obese but soft. Umbilicus without hernia   Female Genitourinary Sexual Maturity Tanner 5 - Adult hair pattern. Note: No vaginal bleeding nor discharge   Rectal Note: Pitzen nodularity and upper part of intergluteal cleft in upper sacrum consistent with pilonidal disease. Mild a tender but no fluctuance. No drainage. Lower intergluteal cleft with moderate Fairburn and but no other pits. Perianal skin clean without fissure or fistula. Normal sphincter tone. No abscess.   Peripheral Vascular Upper Extremity Inspection - Left - No Cyanotic nailbeds, Not Ischemic. Right - No Cyanotic nailbeds, Not Ischemic.  Neurologic Neurologic evaluation reveals -normal attention span and ability to concentrate, able to  name objects and repeat phrases. Appropriate fund of knowledge , normal sensation and normal coordination. Mental Status Affect - not angry, not paranoid. Cranial Nerves-Normal Bilaterally. Gait-Normal.  Neuropsychiatric Mental status exam performed with findings of-able to articulate well with normal speech/language, rate, volume and coherence, thought content normal with ability to perform basic computations and apply abstract reasoning and no evidence of hallucinations, delusions, obsessions or homicidal/suicidal ideation.  Musculoskeletal Global Assessment Spine, Ribs and Pelvis - no instability, subluxation or laxity. Right Upper Extremity - no instability, subluxation or laxity.  Lymphatic Head & Neck  General Head & Neck Lymphatics: Bilateral - Description - No Localized lymphadenopathy. Axillary  General Axillary Region: Bilateral - Description - No Localized lymphadenopathy. Femoral & Inguinal  Generalized Femoral & Inguinal Lymphatics: Left - Description - No Localized lymphadenopathy. Right - Description - No Localized lymphadenopathy.    Assessment & Plan Ardeth Sportsman(Adabelle Griffiths C. Gianni Mihalik MD; 12/14/2014 1:49 PM)  PILONIDAL DISEASE (709.8  L98.8) Impression: Persistent pilonidal disease. While is not a large region, she's had to have the area lanced 5 times. Obviously failed nonoperative management.  I again recommended considering surgery.  Current Plans Schedule for Surgery The anatomy of the intragluteal cleft was discussed. Pathophysiology of pilonidal disease was discussed. The importance of keeping hairs trimmed to avoid recurrence was discussed. Discussion of options such as curretage, excision with closure vs leaving open was discussed. Risks of infection with need for incision and drainage & antibiotics were discussed. I noted a good likelihood this will help address the problem.  At this point, I think the patient would best served with considering surgery to excise the  diseased tissue. I will make an attempt to close but it may need to be left open to allow it to heal with secondary intention and wound packing. Possible recurrences need reoperation or different techniques were discussed as well. I noted that recurrence is higher with poor compliance on hair removal hygiene & overall health. The patient's questions were answered. The patient agrees to proceed. Pt Education - Pamphlet Given - Pilonidal disease: discussed with patient and provided information. Pt Education - [facility] Pilonidal Disease (AT) Pt Education - [facility] Pain Control (Braleigh Massoud)

## 2014-12-20 ENCOUNTER — Encounter: Payer: Self-pay | Admitting: Physician Assistant

## 2015-01-30 HISTORY — PX: PILONIDAL CYST EXCISION: SHX744

## 2015-03-10 ENCOUNTER — Encounter: Payer: Self-pay | Admitting: Physician Assistant

## 2015-04-10 ENCOUNTER — Telehealth: Payer: Self-pay | Admitting: Physician Assistant

## 2015-04-10 DIAGNOSIS — F419 Anxiety disorder, unspecified: Secondary | ICD-10-CM

## 2015-04-10 MED ORDER — ALPRAZOLAM 0.5 MG PO TABS: 0.2500 mg | ORAL_TABLET | Freq: Three times a day (TID) | ORAL | Status: DC | PRN

## 2015-04-10 NOTE — Telephone Encounter (Signed)
Rx called into [facility].

## 2015-04-10 NOTE — Telephone Encounter (Signed)
Patient notified via My Chart.  Please call/fax to pharmacy.  Meds ordered this encounter  Medications  . ALPRAZolam (XANAX) 0.5 MG tablet    Sig: Take 0.5-1 tablets (0.25-0.5 mg total) by mouth 3 (three) times daily as needed for anxiety.    Dispense:  30 tablet    Refill:  0    Order Specific Question:  Supervising Bettylee Feig    Answer:  DOOLITTLE, ROBERT P [[id number]]

## 2015-04-14 DIAGNOSIS — Z0271 Encounter for disability determination: Secondary | ICD-10-CM

## 2015-05-17 ENCOUNTER — Other Ambulatory Visit: Payer: Self-pay | Admitting: Physician Assistant

## 2015-06-20 ENCOUNTER — Encounter: Payer: Self-pay | Admitting: Physician Assistant

## 2015-06-20 DIAGNOSIS — F419 Anxiety disorder, unspecified: Secondary | ICD-10-CM

## 2015-06-20 MED ORDER — VENLAFAXINE HCL ER 75 MG PO CP24: 75.0000 mg | ORAL_CAPSULE | Freq: Every day | ORAL | Status: DC

## 2015-06-27 ENCOUNTER — Ambulatory Visit (INDEPENDENT_AMBULATORY_CARE_PROVIDER_SITE_OTHER): Payer: BLUE CROSS/BLUE SHIELD | Admitting: Physician Assistant

## 2015-06-27 ENCOUNTER — Encounter: Payer: Self-pay | Admitting: Physician Assistant

## 2015-06-27 VITALS — BP 136/86 | HR 99 | Temp 98.5°F | Resp 16 | Ht 69.5 in | Wt 304.2 lb

## 2015-06-27 DIAGNOSIS — Z114 Encounter for screening for human immunodeficiency virus [HIV]: Secondary | ICD-10-CM

## 2015-06-27 DIAGNOSIS — Z0000 Encounter for general adult medical examination without abnormal findings: Secondary | ICD-10-CM | POA: Diagnosis not present

## 2015-06-27 DIAGNOSIS — Z23 Encounter for immunization: Secondary | ICD-10-CM | POA: Diagnosis not present

## 2015-06-27 DIAGNOSIS — I10 Essential (primary) hypertension: Secondary | ICD-10-CM | POA: Diagnosis not present

## 2015-06-27 DIAGNOSIS — F419 Anxiety disorder, unspecified: Secondary | ICD-10-CM | POA: Diagnosis not present

## 2015-06-27 LAB — LIPID PANEL
CHOL/HDL RATIO: 6.6 ratio — AB (ref <=5.0)
Cholesterol: 206 mg/dL — ABNORMAL HIGH (ref 125–200)
HDL: 31 mg/dL — AB (ref >=46)
LDL Cholesterol: 143 mg/dL — ABNORMAL HIGH (ref <130)
Triglycerides: 160 mg/dL — ABNORMAL HIGH (ref <150)
VLDL: 32 mg/dL — ABNORMAL HIGH (ref <30)

## 2015-06-27 LAB — POCT URINALYSIS DIP (MANUAL ENTRY)
BILIRUBIN UA: NEGATIVE
BILIRUBIN UA: NEGATIVE
Blood, UA: NEGATIVE
Glucose, UA: NEGATIVE
Leukocytes, UA: NEGATIVE
Nitrite, UA: NEGATIVE
Protein Ur, POC: NEGATIVE
Spec Grav, UA: >=1.03
Urobilinogen, UA: 0.2
pH, UA: 5.5

## 2015-06-27 LAB — CBC WITH DIFFERENTIAL/PLATELET
Basophils Absolute: 0.1 10*3/uL (ref 0.0–0.1)
Basophils Relative: 1 % (ref 0–1)
EOS ABS: 0.2 10*3/uL (ref 0.0–0.7)
EOS PCT: 3 % (ref 0–5)
HCT: 40.4 % (ref 36.0–46.0)
Hemoglobin: 13.6 g/dL (ref 12.0–15.0)
LYMPHS ABS: 1.9 10*3/uL (ref 0.7–4.0)
LYMPHS PCT: 30 % (ref 12–46)
MCH: 29.6 pg (ref 26.0–34.0)
MCHC: 33.7 g/dL (ref 30.0–36.0)
MCV: 87.8 fL (ref 78.0–100.0)
MONO ABS: 0.5 10*3/uL (ref 0.1–1.0)
MPV: 8.9 fL (ref 8.6–12.4)
Monocytes Relative: 8 % (ref 3–12)
NEUTROS ABS: 3.6 10*3/uL (ref 1.7–7.7)
Neutrophils Relative %: 58 % (ref 43–77)
PLATELETS: 427 10*3/uL — AB (ref 150–400)
RBC: 4.6 MIL/uL (ref 3.87–5.11)
RDW: 13.4 % (ref 11.5–15.5)
WBC: 6.2 10*3/uL (ref 4.0–10.5)

## 2015-06-27 LAB — COMPREHENSIVE METABOLIC PANEL
ALT: 16 U/L (ref 6–29)
AST: 12 U/L (ref 10–30)
Albumin: 4.1 g/dL (ref 3.6–5.1)
Alkaline Phosphatase: 68 U/L (ref 33–115)
BUN: 13 mg/dL (ref 7–25)
CO2: 24 mmol/L (ref 20–31)
Calcium: 8.9 mg/dL (ref 8.6–10.2)
Chloride: 104 mmol/L (ref 98–110)
Creat: 0.58 mg/dL (ref 0.50–1.10)
Glucose, Bld: 86 mg/dL (ref 65–99)
POTASSIUM: 4.5 mmol/L (ref 3.5–5.3)
Sodium: 138 mmol/L (ref 135–146)
Total Bilirubin: 0.6 mg/dL (ref 0.2–1.2)
Total Protein: 6.6 g/dL (ref 6.1–8.1)

## 2015-06-27 LAB — POC MICROSCOPIC URINALYSIS (UMFC)

## 2015-06-27 MED ORDER — VENLAFAXINE HCL ER 75 MG PO CP24: 75.0000 mg | ORAL_CAPSULE | Freq: Every day | ORAL | Status: DC

## 2015-06-27 MED ORDER — LISINOPRIL 5 MG PO TABS: 5.0000 mg | ORAL_TABLET | Freq: Every day | ORAL | Status: DC

## 2015-06-27 NOTE — Patient Instructions (Signed)
I will contact you with your lab results as soon as they are available.   If you have not heard from me in 2 weeks, please contact me.  The fastest way to get your results is to register for My Chart (see the instructions on the last page of this printout).  Keeping You Healthy  Get These Tests 1. Blood Pressure- Have your blood pressure checked once a year by your health care provider.  Normal blood pressure is 120/80. 2. Weight- Have your body mass index (BMI) calculated to screen for obesity.  BMI is measure of body fat based on height and weight.  You can also calculate your own BMI at www.nhlbisupport.com/bmi/. 3. Cholesterol- Have your cholesterol checked every 5 years starting at age 20 then yearly starting at age 45. 4. Chlamydia, HIV, and other sexually transmitted diseases- Get screened every year until age 25, then within three months of each new sexual provider. 5. Pap Test - Every 1-5 years; discuss with your health care provider. 6. Mammogram- Every 1-2 years starting at age 40--50  Take these medicines  Calcium with Vitamin D-Your body needs 1200 mg of Calcium each day and 800-1000 IU of Vitamin D daily.  Your body can only absorb 500 mg of Calcium at a time so Calcium must be taken in 2 or 3 divided doses throughout the day.  Multivitamin with folic acid- Once daily if it is possible for you to become pregnant.  Get these Immunizations  Gardasil-Series of three doses; prevents HPV related illness such as genital warts and cervical cancer.  Menactra-Single dose; prevents meningitis.  Tetanus shot- Every 10 years.  Flu shot-Every year.  Take these steps 1. Do not smoke-Your healthcare provider can help you quit.  For tips on how to quit go to www.smokefree.gov or call 1-800 QUITNOW. 2. Be physically active- Exercise 5 days a week for at least 30 minutes.  If you are not already physically active, start slow and gradually work up to 30 minutes of moderate physical  activity.  Examples of moderate activity include walking briskly, dancing, swimming, bicycling, etc. 3. Breast Cancer- A self breast exam every month is important for early detection of breast cancer.  For more information and instruction on self breast exams, ask your healthcare provider or www.womenshealth.gov/faq/breast-self-exam.cfm. 4. Eat a healthy diet- Eat a variety of healthy foods such as fruits, vegetables, whole grains, low fat milk, low fat cheeses, yogurt, lean meats, poultry and fish, beans, nuts, tofu, etc.  For more information go to www. Thenutritionsource.org 5. Drink alcohol in moderation- Limit alcohol intake to one drink or less per day. Never drink and drive. 6. Depression- Your emotional health is as important as your physical health.  If you're feeling down or losing interest in things you normally enjoy please talk to your healthcare provider about being screened for depression. 7. Dental visit- Brush and floss your teeth twice daily; visit your dentist twice a year. 8. Eye doctor- Get an eye exam at least every 2 years. 9. Helmet use- Always wear a helmet when riding a bicycle, motorcycle, rollerblading or skateboarding. 10. Safe sex- If you may be exposed to sexually transmitted infections, use a condom. 11. Seat belts- Seat belts can save your live; always wear one. 12. Smoke/Carbon Monoxide detectors- These detectors need to be installed on the appropriate level of your home. Replace batteries at least once a year. 13. Skin cancer- When out in the sun please cover up and use sunscreen 15 SPF   or higher. 14. Violence- If anyone is threatening or hurting you, please tell your healthcare provider.        

## 2015-06-27 NOTE — Progress Notes (Signed)
Patient ID: Kimberly Glass, female    DOB: 1985-11-18, 29 y.o.   MRN: [id number]  PCP: Renai Lopata, PA-C  Chief Complaint  Patient presents with  . Annual Exam    no pap smear    Subjective:   HPI: Presents for annual wellness visit. Breast and pap performed at GYN.  Her weight continues to be a concern. Has started exercising and has asked for a Fitbit for Christmas.  Happy with Mirena for contraception.  Had a cold in October and had a lot of coughing. She developed what felt like heart palpitations with it, which resolved when her symptoms resolved. Was evaluated at another facility.  Cervical Cancer Screening: 2014 with GYN Breast Cancer Screening: SBE, clinical exams with GYN Colorectal Cancer Screening: not a candidate Bone Density Testing: not a candidate HIV Screening: completed summer 2016 STI Screening: very low risk Seasonal Influenza Vaccination: done today Td/Tdap Vaccination: current (Tdap 2015) Pneumococcal Vaccination: not a candidate Zoster Vaccination: not a candidate Frequency of Dental evaluation: Q6 months Frequency of Eye evaluation: annually    Patient Active Problem List   Diagnosis Date Noted  . Pilonidal disease 01/11/2014  . Anxiety state, unspecified 06/22/2013  . Obesity (BMI 30-39.9) 06/22/2013  . Migraine 05/10/2013  . Endometriosis   . Irregular menses 07/30/2012  . Dysmenorrhea 07/30/2012  . Female pelvic pain 07/30/2012  . Vulvar lesion 07/30/2012  . Essential hypertension, benign     Past Medical History  Diagnosis Date  . Obesity, unspecified   . Acne vulgaris   . Allergic rhinitis, cause unspecified   . Erythema nodosum     associated with menses; improved with OCP  . Essential hypertension, benign   . Anxiety   . Headache, migraine   . Endometriosis dx Jan 2014  . H/O pilonidal cyst      Prior to Admission medications   Medication Sig Start Date End Date Taking? Authorizing Provider  ALPRAZolam Prudy Feeler) 0.5  MG tablet Take 0.5-1 tablets (0.25-0.5 mg total) by mouth 3 (three) times daily as needed for anxiety. 04/10/15  Yes Kaiden Pech, PA-C  levonorgestrel (MIRENA) 20 MCG/24HR IUD 1 each by Intrauterine route once.   Yes Historical Provider, MD  lisinopril (PRINIVIL,ZESTRIL) 5 MG tablet Take 1 tablet (5 mg total) by mouth daily. PATIENT NEEDS OFFICE VISIT FOR ADDITIONAL REFILLS 05/18/15  Yes Jams Trickett, PA-C  Multiple Vitamins-Minerals (MULTIVITAMIN PO) Take by mouth.   Yes Historical Provider, MD  venlafaxine XR (EFFEXOR-XR) 75 MG 24 hr capsule Take 1 capsule (75 mg total) by mouth daily with breakfast. 06/20/15  Yes Derel Mcglasson, PA-C  ipratropium (ATROVENT) 0.03 % nasal spray Place 2 sprays into both nostrils 2 (two) times daily. 06/28/14 10/29/14  Collie Siad English, PA  ondansetron (ZOFRAN-ODT) 4 MG disintegrating tablet Take 1 tablet by mouth every 8 (eight) hours as needed for nausea or vomiting.  10/18/14   Historical Provider, MD  oxyCODONE-acetaminophen (ROXICET) 5-325 MG per tablet Take 1 tablet by mouth every 4 (four) hours as needed for severe pain. Patient not taking: Reported on 06/27/2015 10/29/14   Dairl Ponder, MD    Allergies  Allergen Reactions  . Diphenhydramine Other (See Comments)    Hyperactivity  . Topiramate Diarrhea  . Yasmin [Drospirenone-Ethinyl Estradiol] Other (See Comments)    Migraine Headache  . Penicillins Hives    Past Surgical History  Procedure Laterality Date  . Wisdom tooth extraction    . Orif radial fracture Right 10/28/2014    Procedure: OPEN REDUCTION  INTERNAL FIXATION RIGHT RADIUS FRACTURE;  Surgeon: Dairl Ponder, MD;  Location: [facility] OR;  Service: Orthopedics;  Laterality: Right;  . Open reduction internal fixation (orif) metacarpal Right 10/28/2014    Procedure: OPEN REDUCTION INTERNAL FIXATION RIGHT FINGER METACARPAL FRACTURE;  Surgeon: Dairl Ponder, MD;  Location: [facility] OR;  Service: Orthopedics;  Laterality: Right;  . Orif scaphoid  fracture Right 10/28/2014    Procedure: OPEN REDUCTION INTERNAL FIXATION (ORIF) SCAPHOID FRACTURE;  Surgeon: Dairl Ponder, MD;  Location: [facility] OR;  Service: Orthopedics;  Laterality: Right;  . Pilonidal cyst excision  01/30/2015    Ardeth Sportsman, MD    Family History  Problem Relation Age of Onset  . Heart disease Father 31    AMI; stents  . Hypertension Father   . Hyperlipidemia Father   . Diabetes Maternal Grandmother   . Endometriosis Maternal Grandmother   . Cancer Mother     kidney  . Endometriosis Mother   . Cancer Maternal Grandfather   . Stroke Paternal Grandfather   . Hypertension Paternal Grandfather   . Hyperlipidemia Paternal Grandfather   . Heart disease Paternal Grandfather     Social History   Social History  . Marital Status: Single    Spouse Name: n/a  . Number of Children: 0  . Years of Education: college   Occupational History  . Business Analyst     [employer]   Social History Main Topics  . Smoking status: Never Smoker   . Smokeless tobacco: Never Used  . Alcohol Use: 0.6 oz/week    1 Cans of beer per week     Comment: rarely   . Drug Use: No  . Sexual Activity:    Partners: Male    Birth Control/ Protection: IUD   Other Topics Concern  . None   Social History Narrative   Lives with her parents, moving in to the house she purchased 03/27/2014.   Engaged to be married 05/04/2016.       Review of Systems  Constitutional: Negative.   HENT: Negative.   Eyes: Negative.   Respiratory: Negative.   Cardiovascular: Negative.   Gastrointestinal: Negative.   Genitourinary: Negative.   Musculoskeletal: Negative.   Skin: Negative.   Neurological: Negative.   Psychiatric/Behavioral: Negative.         Objective:  Physical Exam  Constitutional: She is oriented to person, place, and time. Vital signs are normal. She appears well-developed and well-nourished. She is active and cooperative. No distress.  BP 136/86 mmHg  Pulse 99   Temp(Src) 98.5 F (36.9 C) (Oral)  Resp 16  Ht 5' 9.5" (1.765 m)  Wt 304 lb 3.2 oz (137.984 kg)  BMI 44.29 kg/m2  LMP 06/19/2015   HENT:  Head: Normocephalic and atraumatic.  Right Ear: Hearing, tympanic membrane, external ear and ear canal normal. No foreign bodies.  Left Ear: Hearing, tympanic membrane, external ear and ear canal normal. No foreign bodies.  Nose: Nose normal.  Mouth/Throat: Uvula is midline, oropharynx is clear and moist and mucous membranes are normal. No oral lesions. Normal dentition. No dental abscesses or uvula swelling. No oropharyngeal exudate.  Eyes: Conjunctivae, EOM and lids are normal. Pupils are equal, round, and reactive to light. Right eye exhibits no discharge. Left eye exhibits no discharge. No scleral icterus.  Fundoscopic exam:      The right eye shows no arteriolar narrowing, no AV nicking, no exudate, no hemorrhage and no papilledema. The right eye shows red reflex.  The left eye shows no arteriolar narrowing, no AV nicking, no exudate, no hemorrhage and no papilledema. The left eye shows red reflex.  Neck: Trachea normal, normal range of motion and full passive range of motion without pain. Neck supple. No spinous process tenderness and no muscular tenderness present. No thyroid mass and no thyromegaly present.  Cardiovascular: Normal rate, regular rhythm, normal heart sounds, intact distal pulses and normal pulses.   Pulmonary/Chest: Effort normal and breath sounds normal.  Musculoskeletal: She exhibits no edema or tenderness.       Cervical back: Normal.       Thoracic back: Normal.       Lumbar back: Normal.  Lymphadenopathy:       Head (right side): No tonsillar, no preauricular, no posterior auricular and no occipital adenopathy present.       Head (left side): No tonsillar, no preauricular, no posterior auricular and no occipital adenopathy present.    She has no cervical adenopathy.       Right: No supraclavicular adenopathy present.        Left: No supraclavicular adenopathy present.  Neurological: She is alert and oriented to person, place, and time. She has normal strength and normal reflexes. No cranial nerve deficit. She exhibits normal muscle tone. Coordination and gait normal.  Skin: Skin is warm, dry and intact. No rash noted. She is not diaphoretic. No cyanosis or erythema. Nails show no clubbing.  Psychiatric: She has a normal mood and affect. Her speech is normal and behavior is normal. Judgment and thought content normal.           Assessment & Plan:  1. Annual physical exam Age appropriate anticipatory guidance provided.  2. Need for influenza vaccination - Flu Vaccine QUAD 36+ mos IM  3. Screening for HIV (human immunodeficiency virus) Already performed elsewhere.  4. Essential hypertension, benign Controlled. Reminded her that she needs to change medication BEFORE she has her Mirena removed. - CBC with Differential/Platelet - Comprehensive metabolic panel - Lipid panel - TSH - POCT urinalysis dipstick - POCT Microscopic Urinalysis ([facility]) - lisinopril (PRINIVIL,ZESTRIL) 5 MG tablet; Take 1 tablet (5 mg total) by mouth daily.  Dispense: 90 tablet; Refill: 3  5. Anxiety Well controlled. - venlafaxine XR (EFFEXOR-XR) 75 MG 24 hr capsule; Take 1 capsule (75 mg total) by mouth daily with breakfast.  Dispense: 90 capsule; Refill: 3   Fernande Brashelle S. Avamae Dehaan, PA-C Physician Assistant-Certified [facility]

## 2015-06-28 LAB — TSH: TSH: 1.357 u[IU]/mL (ref 0.350–4.500)

## 2015-07-19 ENCOUNTER — Telehealth: Payer: Self-pay

## 2015-07-19 NOTE — Telephone Encounter (Signed)
Pt dropped off pe form to be completed by [name]   Best phone for pt is [phone]

## 2015-07-20 NOTE — Telephone Encounter (Signed)
I will place in your box. 

## 2015-07-20 NOTE — Telephone Encounter (Signed)
Form completed and signed. Will place in nurses' box before leaving today.

## 2015-07-24 NOTE — Telephone Encounter (Signed)
Called LM form faxed. Copy downstairs to pick up.

## 2015-10-12 ENCOUNTER — Other Ambulatory Visit: Payer: Self-pay | Admitting: Physician Assistant

## 2015-10-13 NOTE — Telephone Encounter (Signed)
Done

## 2015-10-15 ENCOUNTER — Other Ambulatory Visit: Payer: Self-pay | Admitting: *Deleted

## 2015-10-15 NOTE — Telephone Encounter (Signed)
FAXED IN
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
## 2016-05-14 ENCOUNTER — Other Ambulatory Visit: Payer: Self-pay | Admitting: Physician Assistant

## 2016-06-05 ENCOUNTER — Other Ambulatory Visit: Payer: Self-pay

## 2016-06-05 DIAGNOSIS — F419 Anxiety disorder, unspecified: Secondary | ICD-10-CM

## 2016-06-05 NOTE — Telephone Encounter (Addendum)
THIS MESSAGE IS TO CHELLE: PATIENT CALLED TO MAKE AN APPOINTMENT TO HAVE HER COMPLETE PHYSICAL AND MEDICATION REFILLS WITH CHELLE. SHE HAD HER PHYSICAL DONE LAST YEAR ON 06/27/2015 SO WE HAVE TO WAIT UNTIL AFTER THAT DATE. ON CHELLE'S 104 SCHEDULE SHE DID NOT HAVE A PE SLOT FOR 07/02/2016 AND ON 07/09/2016 THE PATIENT WILL BE OUT OF TOWN. SHE WILL CALL NEXT WEEK TO MAKE AN APPOINTMENT WITH CHELLE AT 102 WHEN THE December PROVIDER SCHEDULE COMES OUT. IN THE MEANTIME, SHE ONLY HAS 10 PILLS LEFT OF HER VENLAFAXINE 75 MG 24 HOUR CAPSULE. SHE WOULD LIKE CHELLE TO REFILL IT FOR HER UNTIL SHE CAN GET AN APPOINTMENT IN DEC. BEST PHONE [phone] (CELL) PHARMACY CHOICE IS [facility] IN [place], [place].  MBC

## 2016-06-07 MED ORDER — VENLAFAXINE HCL ER 75 MG PO CP24: 75.0000 mg | ORAL_CAPSULE | Freq: Every day | ORAL | 3 refills | Status: DC

## 2016-06-07 NOTE — Telephone Encounter (Signed)
Meds ordered this encounter  Medications  . venlafaxine XR (EFFEXOR-XR) 75 MG 24 hr capsule    Sig: Take 1 capsule (75 mg total) by mouth daily with breakfast.    Dispense:  90 capsule    Refill:  3    I look forward to seeing her in December!

## 2016-06-10 NOTE — Telephone Encounter (Signed)
LMOM advising of RF.

## 2016-06-28 ENCOUNTER — Ambulatory Visit (INDEPENDENT_AMBULATORY_CARE_PROVIDER_SITE_OTHER): Payer: BLUE CROSS/BLUE SHIELD | Admitting: Physician Assistant

## 2016-06-28 VITALS — BP 108/74 | HR 98 | Temp 98.3°F | Resp 16 | Ht 70.0 in | Wt 294.0 lb

## 2016-06-28 DIAGNOSIS — F419 Anxiety disorder, unspecified: Secondary | ICD-10-CM | POA: Diagnosis not present

## 2016-06-28 DIAGNOSIS — I10 Essential (primary) hypertension: Secondary | ICD-10-CM

## 2016-06-28 DIAGNOSIS — E7800 Pure hypercholesterolemia, unspecified: Secondary | ICD-10-CM

## 2016-06-28 DIAGNOSIS — Z23 Encounter for immunization: Secondary | ICD-10-CM | POA: Diagnosis not present

## 2016-06-28 DIAGNOSIS — Z0000 Encounter for general adult medical examination without abnormal findings: Secondary | ICD-10-CM | POA: Diagnosis not present

## 2016-06-28 LAB — POCT URINALYSIS DIP (MANUAL ENTRY)
BILIRUBIN UA: NEGATIVE
BILIRUBIN UA: NEGATIVE
Blood, UA: NEGATIVE
GLUCOSE UA: NEGATIVE
LEUKOCYTES UA: NEGATIVE
Nitrite, UA: NEGATIVE
Protein Ur, POC: NEGATIVE
Spec Grav, UA: 1.025
Urobilinogen, UA: 0.2
pH, UA: 5

## 2016-06-28 LAB — POC MICROSCOPIC URINALYSIS (UMFC): MUCUS RE: ABSENT

## 2016-06-28 MED ORDER — ALPRAZOLAM 0.5 MG PO TABS: ORAL_TABLET | ORAL | 0 refills | Status: DC

## 2016-06-28 MED ORDER — VENLAFAXINE HCL ER 75 MG PO CP24: 75.0000 mg | ORAL_CAPSULE | Freq: Every day | ORAL | 3 refills | Status: DC

## 2016-06-28 MED ORDER — LISINOPRIL 5 MG PO TABS: 5.0000 mg | ORAL_TABLET | Freq: Every day | ORAL | 3 refills | Status: DC

## 2016-06-28 NOTE — Patient Instructions (Addendum)
   IF you received an x-ray today, you will receive an invoice from [facility]. Please contact [facility] at [phone] with questions or concerns regarding your invoice.   IF you received labwork today, you will receive an invoice from [facility]. Please contact [facility] at [phone] with questions or concerns regarding your invoice.   Our billing staff will not be able to assist you with questions regarding bills from these companies.  You will be contacted with the lab results as soon as they are available. The fastest way to get your results is to activate your My Chart account. Instructions are located on the last page of this paperwork. If you have not heard from us regarding the results in 2 weeks, please contact this office.     Keeping You Healthy  Get These Tests 1. Blood Pressure- Have your blood pressure checked once a year by your health care provider.  Normal blood pressure is 120/80. 2. Weight- Have your body mass index (BMI) calculated to screen for obesity.  BMI is measure of body fat based on height and weight.  You can also calculate your own BMI at www.nhlbisupport.com/bmi/. 3. Cholesterol- Have your cholesterol checked every 5 years starting at age 20 then yearly starting at age 45. 4. Chlamydia, HIV, and other sexually transmitted diseases- Get screened every year until age 25, then within three months of each new sexual provider. 5. Pap Test - Every 1-5 years; discuss with your health care provider. 6. Mammogram- Every 1-2 years starting at age 40--50  Take these medicines  Calcium with Vitamin D-Your body needs 1200 mg of Calcium each day and 800-1000 IU of Vitamin D daily.  Your body can only absorb 500 mg of Calcium at a time so Calcium must be taken in 2 or 3 divided doses throughout the day.  Multivitamin with folic acid- Once daily if it is possible for you to become pregnant.  Get these  Immunizations  Gardasil-Series of three doses; prevents HPV related illness such as genital warts and cervical cancer.  Menactra-Single dose; prevents meningitis.  Tetanus shot- Every 10 years.  Flu shot-Every year.  Take these steps 1. Do not smoke-Your healthcare provider can help you quit.  For tips on how to quit go to www.smokefree.gov or call 1-800 QUITNOW. 2. Be physically active- Exercise 5 days a week for at least 30 minutes.  If you are not already physically active, start slow and gradually work up to 30 minutes of moderate physical activity.  Examples of moderate activity include walking briskly, dancing, swimming, bicycling, etc. 3. Breast Cancer- A self breast exam every month is important for early detection of breast cancer.  For more information and instruction on self breast exams, ask your healthcare provider or www.womenshealth.gov/faq/breast-self-exam.cfm. 4. Eat a healthy diet- Eat a variety of healthy foods such as fruits, vegetables, whole grains, low fat milk, low fat cheeses, yogurt, lean meats, poultry and fish, beans, nuts, tofu, etc.  For more information go to www. Thenutritionsource.org 5. Drink alcohol in moderation- Limit alcohol intake to one drink or less per day. Never drink and drive. 6. Depression- Your emotional health is as important as your physical health.  If you're feeling down or losing interest in things you normally enjoy please talk to your healthcare provider about being screened for depression. 7. Dental visit- Brush and floss your teeth twice daily; visit your dentist twice a year. 8. Eye doctor- Get an eye exam at least every   2 years. 9. Helmet use- Always wear a helmet when riding a bicycle, motorcycle, rollerblading or skateboarding. 10. Safe sex- If you may be exposed to sexually transmitted infections, use a condom. 11. Seat belts- Seat belts can save your live; always wear one. 12. Smoke/Carbon Monoxide detectors- These detectors need to  be installed on the appropriate level of your home. Replace batteries at least once a year. 13. Skin cancer- When out in the sun please cover up and use sunscreen 15 SPF or higher. 14. Violence- If anyone is threatening or hurting you, please tell your healthcare provider.        

## 2016-06-28 NOTE — Progress Notes (Signed)
Patient ID: Kimberly Glass, female    DOB: Apr 24, 1986, 30 y.o.   MRN: [id number]  PCP: Porfirio Oarhelle Carmita Boom, PA-C  Chief Complaint  Patient presents with  . Annual Exam    no pap  . Medication Refill    lisinopril, venlafaxine, xanax    Subjective:   Presents for [name].  Cervical Cancer Screening: with GYN Breast Cancer Screening: with GYN Colorectal Cancer Screening: not yet a candidate Bone Density Testing: not yet a candidate HIV Screening: complete STI Screening: very low risk Seasonal Influenza Vaccination: today Td/Tdap Vaccination: current Pneumococcal Vaccination: not yet a candidate Zoster Vaccination: not yet a candidate Frequency of Dental evaluation: Q6 months Frequency of Eye evaluation: annually   Takes alprazolam PRN. Out x several weeks, but that supply was filled in 09/2015. Notes she uses it more around the holidays, and her best friend's husband died by suicide 06/04/2016.  Married since our last visit[place]. Very happy with her current contraception. No plans for conception in the next 12 months. Knows she'll need to change antihypertensive before stopping contraception.     Patient Active Problem List   Diagnosis Date Noted  . Pilonidal disease 01/11/2014  . Anxiety state, unspecified 06/22/2013  . Obesity (BMI 30-39.9) 06/22/2013  . Migraine 05/10/2013  . Endometriosis   . Irregular menses 07/30/2012  . Dysmenorrhea 07/30/2012  . Female pelvic pain 07/30/2012  . Vulvar lesion 07/30/2012  . Essential hypertension, benign     Past Medical History:  Diagnosis Date  . Acne vulgaris   . Allergic rhinitis, cause unspecified   . Anxiety   . Endometriosis dx Jan 2014  . Erythema nodosum    associated with menses; improved with OCP  . Essential hypertension, benign   . H/O pilonidal cyst   . Headache, migraine   . Obesity, unspecified      Prior to Admission medications   Medication Sig  Start Date End Date Taking? Authorizing Provider  ALPRAZolam (XANAX) 0.5 MG tablet TAKE 1/2 TO 1 TABLET BY MOUTH THREE TIMES DAILY AS NEEDED FOR ANXIETY 05/14/16  Yes Tollie Canada, PA-C  levonorgestrel (MIRENA) 20 MCG/24HR IUD 1 each by Intrauterine route once.   Yes Historical Provider, MD  lisinopril (PRINIVIL,ZESTRIL) 5 MG tablet Take 1 tablet (5 mg total) by mouth daily. 06/27/15  Yes Tacari Repass, PA-C  Multiple Vitamins-Minerals (MULTIVITAMIN PO) Take by mouth.   Yes Historical Provider, MD  venlafaxine XR (EFFEXOR-XR) 75 MG 24 hr capsule Take 1 capsule (75 mg total) by mouth daily with breakfast. 06/07/16  Yes Carolena Fairbank, PA-C    Allergies  Allergen Reactions  . Diphenhydramine Other (See Comments)    Hyperactivity  . Topiramate Diarrhea  . Yasmin [Drospirenone-Ethinyl Estradiol] Other (See Comments)    Migraine Headache  . Penicillins Hives    Past Surgical History:  Procedure Laterality Date  . OPEN REDUCTION INTERNAL FIXATION (ORIF) METACARPAL Right 10/28/2014   Procedure: OPEN REDUCTION INTERNAL FIXATION RIGHT FINGER METACARPAL FRACTURE;  Surgeon: Dairl PonderMatthew Weingold, MD;  Location: [facility] OR;  Service: Orthopedics;  Laterality: Right;  . ORIF RADIAL FRACTURE Right 10/28/2014   Procedure: OPEN REDUCTION INTERNAL FIXATION RIGHT RADIUS FRACTURE;  Surgeon: Dairl PonderMatthew Weingold, MD;  Location: [facility] OR;  Service: Orthopedics;  Laterality: Right;  . ORIF SCAPHOID FRACTURE Right 10/28/2014   Procedure: OPEN REDUCTION INTERNAL FIXATION (ORIF) SCAPHOID FRACTURE;  Surgeon: Dairl PonderMatthew Weingold, MD;  Location: [facility] OR;  Service: Orthopedics;  Laterality: Right;  . PILONIDAL CYST EXCISION  01/30/2015   Ardeth Sportsman, MD  . WISDOM TOOTH EXTRACTION      Family History  Problem Relation Age of Onset  . Heart disease Father 28    AMI; stents  . Hypertension Father   . Hyperlipidemia Father   . Diabetes Maternal Grandmother   . Endometriosis Maternal Grandmother   . Cancer Mother     kidney  .  Endometriosis Mother   . Cancer Maternal Grandfather   . Stroke Paternal Grandfather   . Hypertension Paternal Grandfather   . Hyperlipidemia Paternal Grandfather   . Heart disease Paternal Grandfather     Social History   Social History  . Marital status: Married    Spouse name: Belisa Eichholz  . Number of children: 0  . Years of education: college   Occupational History  . 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 Smoking status: Never Smoker  . Smokeless tobacco: Never Used  . Alcohol use 0.6 oz/week    1 Cans of beer per week     Comment: rarely   . Drug use: No  . Sexual activity: Yes    Partners: Male    Birth control/ protection: IUD   Other Topics Concern  . None   Social History Narrative   Lives with her fiance in the house she purchased 03/27/2014.   Engaged to be married 05/04/2016.   Her parents live nearby.   Her brother, sister-in-law and nephew live nearby.       Review of Systems  Constitutional: Negative.   HENT: Negative.   Eyes: Negative.   Respiratory: Negative.   Cardiovascular: Positive for palpitations (described as "stong beat," daily, without associated CP, SOB, nausea, dizziness). Negative for chest pain and leg swelling.  Gastrointestinal: Negative.   Endocrine: Negative.   Genitourinary: Negative.   Musculoskeletal: Negative.   Skin: Negative.   Allergic/Immunologic: Negative.   Neurological: Negative.   Hematological: Negative.   Psychiatric/Behavioral: Negative for agitation, behavioral problems, confusion, decreased concentration, dysphoric mood, hallucinations, self-injury, sleep disturbance and suicidal ideas. The patient is nervous/anxious. The patient is not hyperactive.         Objective:  Physical Exam  Constitutional: She is oriented to person, place, and time. Vital signs are normal. She appears well-developed and well-nourished. She is active and cooperative. No distress.    BP 108/74 (BP Location: Right Arm, Patient Position: Sitting, Cuff Size: Large)   Pulse 98   Temp 98.3 F (36.8 C)   Resp 16   Ht 5\' 10"  (1.778 m)   Wt 294 lb (133.4 kg)   LMP 06/07/2016 (Approximate)   SpO2 98%   BMI 42.18 kg/m    HENT:  Head: Normocephalic and atraumatic.  Right Ear: Hearing, tympanic membrane, external ear and ear canal normal. No foreign bodies.  Left Ear: Hearing, tympanic membrane, external ear and ear canal normal. No foreign bodies.  Nose: Nose normal.  Mouth/Throat: Uvula is midline, oropharynx is clear and moist and mucous membranes are normal. No oral lesions. Normal dentition. No dental abscesses or uvula swelling. No oropharyngeal exudate.  Eyes: Conjunctivae, EOM and lids are normal. Pupils are equal, round, and reactive to light. Right eye exhibits no discharge. Left eye exhibits no discharge. No scleral icterus.  Fundoscopic exam:      The right eye shows no arteriolar narrowing, no AV nicking, no exudate, no hemorrhage and no papilledema. The right eye shows red reflex.  The left eye shows no arteriolar narrowing, no AV nicking, no exudate, no hemorrhage and no papilledema. The left eye shows red reflex.  Neck: Trachea normal, normal range of motion and full passive range of motion without pain. Neck supple. No spinous process tenderness and no muscular tenderness present. No thyroid mass and no thyromegaly present.  Cardiovascular: Normal rate, regular rhythm, normal heart sounds, intact distal pulses and normal pulses.   Pulmonary/Chest: Effort normal and breath sounds normal.  Abdominal: Soft. Bowel sounds are normal. She exhibits no distension and no mass. There is no tenderness. There is no rebound and no guarding.  Musculoskeletal: She exhibits no edema or tenderness.       Cervical back: Normal.       Thoracic back: Normal.       Lumbar back: Normal.  Lymphadenopathy:       Head (right side): No tonsillar, no preauricular, no posterior  auricular and no occipital adenopathy present.       Head (left side): No tonsillar, no preauricular, no posterior auricular and no occipital adenopathy present.    She has no cervical adenopathy.       Right: No supraclavicular adenopathy present.       Left: No supraclavicular adenopathy present.  Neurological: She is alert and oriented to person, place, and time. She has normal strength and normal reflexes. No cranial nerve deficit. She exhibits normal muscle tone. Coordination and gait normal.  Skin: Skin is warm, dry and intact. No rash noted. She is not diaphoretic. No cyanosis or erythema. Nails show no clubbing.  Psychiatric: She has a normal mood and affect. Her speech is normal and behavior is normal. Judgment and thought content normal.           Assessment & Plan:  1. Annual physical exam Age appropriate anticipatory guidance provided.  2. Need for influenza vaccination - Flu Vaccine QUAD 36+ mos IM  3. Anxiety Controlled.  - venlafaxine XR (EFFEXOR-XR) 75 MG 24 hr capsule; Take 1 capsule (75 mg total) by mouth daily with breakfast.  Dispense: 90 capsule; Refill: 3 - ALPRAZolam (XANAX) 0.5 MG tablet; TAKE 1/2 TO 1 TABLET BY MOUTH THREE TIMES DAILY AS NEEDED FOR ANXIETY  Dispense: 30 tablet; Refill: 0  4. Essential hypertension, benign Controlled. - lisinopril (PRINIVIL,ZESTRIL) 5 MG tablet; Take 1 tablet (5 mg total) by mouth daily.  Dispense: 90 tablet; Refill: 3 - CBC with Differential/Platelet - Comprehensive metabolic panel - POCT urinalysis dipstick - POCT Microscopic Urinalysis ([facility])  5. Elevated cholesterol Await lab results. - Lipid panel   Fernande Brashelle S. Yesli Vanderhoff, PA-C Physician Assistant-Certified [facility]

## 2016-06-29 ENCOUNTER — Encounter: Payer: Self-pay | Admitting: Physician Assistant

## 2016-06-29 LAB — CBC WITH DIFFERENTIAL/PLATELET
Basophils Absolute: 0.1 10*3/uL (ref 0.0–0.2)
Basos: 1 %
EOS (ABSOLUTE): 0.1 10*3/uL (ref 0.0–0.4)
Eos: 1 %
Hematocrit: 41.1 % (ref 34.0–46.6)
Hemoglobin: 14.1 g/dL (ref 11.1–15.9)
Immature Grans (Abs): 0 10*3/uL (ref 0.0–0.1)
Immature Granulocytes: 0 %
Lymphocytes Absolute: 1.8 10*3/uL (ref 0.7–3.1)
Lymphs: 24 %
MCH: 30.4 pg (ref 26.6–33.0)
MCHC: 34.3 g/dL (ref 31.5–35.7)
MCV: 89 fL (ref 79–97)
MONOS ABS: 0.4 10*3/uL (ref 0.1–0.9)
Monocytes: 6 %
NEUTROS PCT: 68 %
Neutrophils Absolute: 4.9 10*3/uL (ref 1.4–7.0)
PLATELETS: 475 10*3/uL — AB (ref 150–379)
RBC: 4.64 x10E6/uL (ref 3.77–5.28)
RDW: 13.4 % (ref 12.3–15.4)
WBC: 7.2 10*3/uL (ref 3.4–10.8)

## 2016-06-29 LAB — COMPREHENSIVE METABOLIC PANEL
A/G RATIO: 1.7 (ref 1.2–2.2)
ALK PHOS: 68 IU/L (ref 39–117)
ALT: 14 IU/L (ref 0–32)
AST: 12 IU/L (ref 0–40)
Albumin: 4.7 g/dL (ref 3.5–5.5)
BUN/Creatinine Ratio: 22 (ref 9–23)
BUN: 13 mg/dL (ref 6–20)
Bilirubin Total: 0.5 mg/dL (ref 0.0–1.2)
CALCIUM: 9.7 mg/dL (ref 8.7–10.2)
CO2: 21 mmol/L (ref 18–29)
CREATININE: 0.58 mg/dL (ref 0.57–1.00)
Chloride: 99 mmol/L (ref 96–106)
GFR calc Af Amer: 142 mL/min/{1.73_m2} (ref >59)
GFR, EST NON AFRICAN AMERICAN: 123 mL/min/{1.73_m2} (ref >59)
Globulin, Total: 2.7 g/dL (ref 1.5–4.5)
Glucose: 94 mg/dL (ref 65–99)
POTASSIUM: 4.8 mmol/L (ref 3.5–5.2)
Sodium: 138 mmol/L (ref 134–144)
Total Protein: 7.4 g/dL (ref 6.0–8.5)

## 2016-06-29 LAB — LIPID PANEL
CHOL/HDL RATIO: 5.4 ratio — AB (ref 0.0–4.4)
Cholesterol, Total: 225 mg/dL — ABNORMAL HIGH (ref 100–199)
HDL: 42 mg/dL (ref >39)
LDL Calculated: 149 mg/dL — ABNORMAL HIGH (ref 0–99)
Triglycerides: 169 mg/dL — ABNORMAL HIGH (ref 0–149)
VLDL Cholesterol Cal: 34 mg/dL (ref 5–40)

## 2016-07-08 ENCOUNTER — Other Ambulatory Visit: Payer: Self-pay | Admitting: Oncology

## 2016-08-13 ENCOUNTER — Encounter: Payer: Self-pay | Admitting: Physician Assistant

## 2016-08-13 ENCOUNTER — Ambulatory Visit (INDEPENDENT_AMBULATORY_CARE_PROVIDER_SITE_OTHER): Payer: BLUE CROSS/BLUE SHIELD | Admitting: Physician Assistant

## 2016-08-13 VITALS — BP 132/87 | HR 88 | Temp 98.8°F | Resp 18 | Ht 70.0 in | Wt 294.0 lb

## 2016-08-13 DIAGNOSIS — M79622 Pain in left upper arm: Secondary | ICD-10-CM

## 2016-08-13 NOTE — Progress Notes (Signed)
     Patient ID: Kimberly Glass, female    DOB: 08/20/1985, 31 y.o.   MRN: [id number]  PCP: Porfirio Oarhelle Jeffery, PA-C  Chief Complaint  Patient presents with  . Arm Pain    left arm pit x 1 week    Subjective:   Presents for evaluation of left axilla pain.  Pt is a 31yo caucasian female who presents with 1 week of intermittnent left axilla pain. She states that it comes and goes, feels deep, and is sometimes in the axilla and sometimes on the medial upper arm. The pain is not affected by movement, nor is it tender to the touch. She also mentions often sleeping with her left arm extended over her head. Pt states that she has tried Aleve, but didn't notice if it worked or if it went away on its own. Pt states that the only thing that gave her some relief was a shoulder massage from her husband. She denies any fever, chills, fatigue, weight changes, cough, SOB, or chest pain. She also denies any weakness, tingling, or numbness in her arms or hands.  Review of Systems In addition to that mentioned in HPI above: Abd: Denies abdominal pain, nausea, vomiting, diarrhea, or constipation.   Patient Active Problem List   Diagnosis Date Noted  . Pilonidal disease 01/11/2014  . Anxiety state, unspecified 06/22/2013  . Obesity (BMI 30-39.9) 06/22/2013  . Migraine 05/10/2013  . Endometriosis   . Irregular menses 07/30/2012  . Dysmenorrhea 07/30/2012  . Female pelvic pain 07/30/2012  . Vulvar lesion 07/30/2012  . Essential hypertension, benign      Prior to Admission medications   Medication Sig Start Date End Date Taking? Authorizing Provider  ALPRAZolam (XANAX) 0.5 MG tablet TAKE 1/2 TO 1 TABLET BY MOUTH THREE TIMES DAILY AS NEEDED FOR ANXIETY 06/28/16   Porfirio Oarhelle Jeffery, PA-C  levonorgestrel (MIRENA) 20 MCG/24HR IUD 1 each by Intrauterine route once.    Historical Provider, MD  lisinopril (PRINIVIL,ZESTRIL) 5 MG tablet Take 1 tablet (5 mg total) by mouth daily. 06/28/16   Chelle Jeffery, PA-C    Multiple Vitamins-Minerals (MULTIVITAMIN PO) Take by mouth.    Historical Provider, MD  venlafaxine XR (EFFEXOR-XR) 75 MG 24 hr capsule Take 1 capsule (75 mg total) by mouth daily with breakfast. 06/28/16   Porfirio Oarhelle Jeffery, PA-C     Allergies  Allergen Reactions  . Diphenhydramine Other (See Comments)    Hyperactivity  . Topiramate Diarrhea  . Yasmin [Drospirenone-Ethinyl Estradiol] Other (See Comments)    Migraine Headache  . Penicillins Hives       Objective:  Physical Exam Pulm: Good respiratory effort. CTAB. No wheezing, rales, or rhonchi. CV: RRR. No M/R/G. Ext: No tenderness to palpation of the left axilla, chest, or arm. 2+ radial pulses. No gross sensory deficits in upper extremeities. 5/5 strength at shoulder, wrist, and elbows bilaterally.      Assessment & Plan:   1. Axillary pain, left Unknown etiology. Pt advised to continue massages from husband and to try a daily scheduled ibuprofen regimen for two weeks and to try adjusting the way she sleeps at night. Pt advised to return if symptoms persist or if new or worrisome symptoms arise.   Georgiana SpinnerHannah Bradley Keiji Melland, PA-S

## 2016-08-13 NOTE — Patient Instructions (Addendum)
We don't know why you're having this pain. Please continue the ibuprofen, and ask your husband for more massages since that seemed to help.     IF you received an x-ray today, you will receive an invoice from [facility]. Please contact [facility] at [phone] with questions or concerns regarding your invoice.   IF you received labwork today, you will receive an invoice from [facility]. Please contact [facility] at [phone] with questions or concerns regarding your invoice.   Our billing staff will not be able to assist you with questions regarding bills from these companies.  You will be contacted with the lab results as soon as they are available. The fastest way to get your results is to activate your My Chart account. Instructions are located on the last page of this paperwork. If you have not heard from us regarding the results in 2 weeks, please contact this office.

## 2016-08-13 NOTE — Progress Notes (Signed)
Patient ID: Kimberly Glass, female    DOB: 05-Jun-1986, 31 y.o.   MRN: [id number]  PCP: Porfirio Oar, PA-C  Chief Complaint  Patient presents with  . Arm Pain    left arm pit x 1 week    Subjective:   Presents for evaluation of intermittent LEFT axilliary pain x 1 week.  The pain is a deep ache. No associated lump or redness. Sometimes extends into the lateral chest wall. No association with movement. Not tender to touch. Uses Mirena for contraception.  No symptoms of carpal tunnel. No swelling of the arm or hand. No trauma/injury that she can identify. Notes she sleeps with her LEFT arm extended above her head. Took a dose of Aleve, but isn't sure if it helped. Her husband massaged her neck, back and shoulders, and that seemed to have helped.    Review of Systems As above.    Patient Active Problem List   Diagnosis Date Noted  . Pilonidal disease 01/11/2014  . Anxiety state, unspecified 06/22/2013  . Obesity (BMI 30-39.9) 06/22/2013  . Migraine 05/10/2013  . Endometriosis   . Irregular menses 07/30/2012  . Dysmenorrhea 07/30/2012  . Female pelvic pain 07/30/2012  . Vulvar lesion 07/30/2012  . Essential hypertension, benign      Prior to Admission medications   Medication Sig Start Date End Date Taking? Authorizing Provider  ALPRAZolam (XANAX) 0.5 MG tablet TAKE 1/2 TO 1 TABLET BY MOUTH THREE TIMES DAILY AS NEEDED FOR ANXIETY 06/28/16   Porfirio Oar, PA-C  levonorgestrel (MIRENA) 20 MCG/24HR IUD 1 each by Intrauterine route once.    Historical Provider, MD  lisinopril (PRINIVIL,ZESTRIL) 5 MG tablet Take 1 tablet (5 mg total) by mouth daily. 06/28/16   Aarthi Modisette, PA-C  Multiple Vitamins-Minerals (MULTIVITAMIN PO) Take by mouth.    Historical Provider, MD  venlafaxine XR (EFFEXOR-XR) 75 MG 24 hr capsule Take 1 capsule (75 mg total) by mouth daily with breakfast. 06/28/16   Porfirio Oar, PA-C     Allergies  Allergen Reactions  . Diphenhydramine  Other (See Comments)    Hyperactivity  . Topiramate Diarrhea  . Yasmin [Drospirenone-Ethinyl Estradiol] Other (See Comments)    Migraine Headache  . Penicillins Hives       Objective:  Physical Exam  Constitutional: She is oriented to person, place, and time. She appears well-developed and well-nourished. She is active and cooperative. No distress.  BP 132/87 (BP Location: Right Arm, Patient Position: Sitting, Cuff Size: Large)   Pulse 88   Temp 98.8 F (37.1 C) (Oral)   Resp 18   Ht 5\' 10"  (1.778 m)   Wt 294 lb (133.4 kg)   SpO2 97%   BMI 42.18 kg/m   HENT:  Head: Normocephalic and atraumatic.  Right Ear: Hearing normal.  Left Ear: Hearing normal.  Eyes: Conjunctivae are normal. No scleral icterus.  Neck: Normal range of motion. Neck supple. No thyromegaly present.  Cardiovascular: Normal rate, regular rhythm and normal heart sounds.   Pulses:      Radial pulses are 2+ on the right side, and 2+ on the left side.  Pulmonary/Chest: Effort normal and breath sounds normal. She exhibits no mass, no tenderness, no bony tenderness, no laceration, no crepitus, no edema, no deformity, no swelling and no retraction.  Musculoskeletal:       Left shoulder: Normal.       Left elbow: Normal.       Left wrist: Normal.  Left upper arm: Normal.       Left forearm: Normal.       Left hand: Normal.  Lymphadenopathy:       Head (right side): No tonsillar, no preauricular, no posterior auricular and no occipital adenopathy present.       Head (left side): No tonsillar, no preauricular, no posterior auricular and no occipital adenopathy present.    She has no cervical adenopathy.       Left axillary: No pectoral and no lateral adenopathy present.      Right: No supraclavicular adenopathy present.       Left: No supraclavicular adenopathy present.  Neurological: She is alert and oriented to person, place, and time. No sensory deficit.  Skin: Skin is warm, dry and intact. No rash noted.  No cyanosis or erythema. Nails show no clubbing.  Psychiatric: She has a normal mood and affect. Her speech is normal and behavior is normal.           Assessment & Plan:   1. Axillary pain, left No tenderness. No lumps. No discoloration. No alleviating or aggravating factors other than massage seems to help. Encourage massage and scheduled doses of NSAIDS x 1-2 weeks. RTC if worsens/persists.   Fernande Brashelle S. Zakary Kimura, PA-C Physician Assistant-Certified Primary Care at [facility]

## 2016-12-06 ENCOUNTER — Ambulatory Visit (INDEPENDENT_AMBULATORY_CARE_PROVIDER_SITE_OTHER): Payer: BLUE CROSS/BLUE SHIELD | Admitting: Physician Assistant

## 2016-12-06 ENCOUNTER — Encounter: Payer: Self-pay | Admitting: Physician Assistant

## 2016-12-06 VITALS — BP 125/87 | HR 95 | Temp 98.4°F | Resp 18 | Ht 69.49 in | Wt 299.8 lb

## 2016-12-06 DIAGNOSIS — M25551 Pain in right hip: Secondary | ICD-10-CM | POA: Diagnosis not present

## 2016-12-06 MED ORDER — MELOXICAM 15 MG PO TABS: 15.0000 mg | ORAL_TABLET | Freq: Every day | ORAL | 1 refills | Status: DC

## 2016-12-06 NOTE — Patient Instructions (Addendum)
If the pain persists or recurs, we'll plan to do xrays and consider a referral to orthopedics.    IF you received an x-ray today, you will [facility] an [facility]. Please contact [facility] at [phone] with questions or concerns regarding your invoice.   IF you received labwork today, you will receive an invoice from [facility]. Please contact [facility] at [phone] with questions or concerns regarding your invoice.   Our billing staff will not be able to assist you with questions regarding bills from these companies.  You will be contacted with the lab results as soon as they are available. The fastest way to get your results is to activate your My Chart account. Instructions are located on the last page of this paperwork. If you have not heard from us regarding the results in 2 weeks, please contact this office.

## 2016-12-06 NOTE — Progress Notes (Signed)
Patient ID: Kimberly Glass, female    DOB: 1986-05-12, 31 y.o.   MRN: [id number]  PCP: Porfirio Oar, PA-C  Chief Complaint  Patient presents with  . Hip Pain    X 1 mth- right hip pain   . Mass    X 2-3 mth- pt states that she has a "lump" on her right lower leg    Subjective:   Presents for evaluation of RIGHT hip pain x 1 month.  No trauma or injury recalled. Started in both hips, posteriorly, as a sensation that her skin was stretching. That sensation resolved, and she now has pain and tenderness along the RIGHT anterior hip bone (points to the ASIS). The pain occurs only with weight bearing after she has been sitting for a while (perhaps about an hour) and when she first gets up in the morning. The pain in tolerable, and resolves as she walks around a little.  She describes it feeling "like a bruise," and hasn't tried taking anything to alleviate or prevent the pain.   She has longstanding LBP related to scoliosis and leg length discrepancy, and sees a chiropractor regularly. Adjustments have not changed the hip discomfort. She doesn't use the lift in her LEFT shoe because it is uncomfortable to the arch of the LEFT foot.  No loss of bowel/bladder control. No saddle anesthesia. No paresthesias in the legs.  No urinary changes, stool changes.  In addition, she notes a small lump on the anterior RIGHT lower leg x 2 months. Incidental finding. Not tender. Unchanged. Location she often bumps, but doesn't recall a specific trauma to this area associated with the development of the lump.   Review of Systems As above.    Patient Active Problem List   Diagnosis Date Noted  . Pilonidal disease 01/11/2014  . Anxiety state, unspecified 06/22/2013  . BMI 40.0-44.9, adult (HCC) 06/22/2013  . Migraine 05/10/2013  . Endometriosis   . Irregular menses 07/30/2012  . Dysmenorrhea 07/30/2012  . Female pelvic pain 07/30/2012  . Vulvar lesion 07/30/2012  . Essential  hypertension, benign      Prior to Admission medications   Medication Sig Start Date End Date Taking? Authorizing Provider  ALPRAZolam (XANAX) 0.5 MG tablet TAKE 1/2 TO 1 TABLET BY MOUTH THREE TIMES DAILY AS NEEDED FOR ANXIETY 06/28/16  Yes Nilaya Bouie, PA-C  levonorgestrel (MIRENA) 20 MCG/24HR IUD 1 each by Intrauterine route once.   Yes [provider]  lisinopril (PRINIVIL,ZESTRIL) 5 MG tablet Take 1 tablet (5 mg total) by mouth daily. 06/28/16  Yes Meric Joye, PA-C  Multiple Vitamins-Minerals (MULTIVITAMIN PO) Take by mouth.   Yes [provider]  venlafaxine XR (EFFEXOR-XR) 75 MG 24 hr capsule Take 1 capsule (75 mg total) by mouth daily with breakfast. 06/28/16  Yes Junell Cullifer, PA-C     Allergies  Allergen Reactions  . Diphenhydramine Other (See Comments)    Hyperactivity  . Topiramate Diarrhea  . Yasmin [Drospirenone-Ethinyl Estradiol] Other (See Comments)    Migraine Headache  . Penicillins Hives       Objective:  Physical Exam  Constitutional: She is oriented to person, place, and time. She appears well-developed and well-nourished. She is active and cooperative. No distress.  BP 125/87 (BP Location: Right Arm, Patient Position: Sitting, Cuff Size: Large)   Pulse 95   Temp 98.4 F (36.9 C) (Oral)   Resp 18   Ht 5' 9.49" (1.765 m)   Wt 299 lb 12.8 oz (136 kg)  LMP 11/23/2016 (Approximate)   SpO2 97%   BMI 43.65 kg/m   HENT:  Head: Normocephalic and atraumatic.  Right Ear: Hearing normal.  Left Ear: Hearing normal.  Eyes: Conjunctivae are normal. No scleral icterus.  Neck: Normal range of motion. Neck supple. No thyromegaly present.  Cardiovascular: Normal rate, regular rhythm and normal heart sounds.   Pulses:      Radial pulses are 2+ on the right side, and 2+ on the left side.  Pulmonary/Chest: Effort normal and breath sounds normal.  Musculoskeletal:       Right hip: She exhibits bony tenderness. She exhibits normal range of  motion, normal strength, no tenderness, no swelling, no crepitus, no deformity and no laceration.       Left hip: Normal.       Right knee: Normal.       Lumbar back: Normal.       Right upper leg: Normal.       Legs: Lymphadenopathy:       Head (right side): No tonsillar, no preauricular, no posterior auricular and no occipital adenopathy present.       Head (left side): No tonsillar, no preauricular, no posterior auricular and no occipital adenopathy present.    She has no cervical adenopathy.       Right: No supraclavicular adenopathy present.       Left: No supraclavicular adenopathy present.  Neurological: She is alert and oriented to person, place, and time. No sensory deficit.  Skin: Skin is warm, dry and intact. No rash noted. No cyanosis or erythema. Nails show no clubbing.  Psychiatric: She has a normal mood and affect. Her speech is normal and behavior is normal.    Wt Readings from Last 3 Encounters:  12/06/16 299 lb 12.8 oz (136 kg)  08/13/16 294 lb (133.4 kg)  06/28/16 294 lb (133.4 kg)      Assessment & Plan:   1. Pain of right hip joint Trial of meloxicam, daily x 2 weeks. If no improvement, or if recurs with stopping meloxicam, RTC for radiographs and possible referral. - meloxicam (MOBIC) 15 MG tablet; Take 1 tablet (15 mg total) by mouth daily.  Dispense: 30 tablet; Refill: 1   Return if symptoms worsen or fail to improve, and for wellness exam as scheduled.   Fernande Brashelle S. Bell Carbo, PA-C Primary Care at [facility]

## 2016-12-19 ENCOUNTER — Encounter: Payer: Self-pay | Admitting: Physician Assistant

## 2016-12-19 ENCOUNTER — Ambulatory Visit (INDEPENDENT_AMBULATORY_CARE_PROVIDER_SITE_OTHER): Payer: BLUE CROSS/BLUE SHIELD

## 2016-12-19 ENCOUNTER — Ambulatory Visit (INDEPENDENT_AMBULATORY_CARE_PROVIDER_SITE_OTHER): Payer: BLUE CROSS/BLUE SHIELD | Admitting: Physician Assistant

## 2016-12-19 VITALS — BP 124/87 | HR 83 | Temp 98.3°F | Resp 16 | Ht 69.5 in | Wt 302.4 lb

## 2016-12-19 DIAGNOSIS — M25551 Pain in right hip: Secondary | ICD-10-CM | POA: Diagnosis not present

## 2016-12-19 DIAGNOSIS — Z6841 Body Mass Index (BMI) 40.0 and over, adult: Secondary | ICD-10-CM | POA: Diagnosis not present

## 2016-12-19 NOTE — Progress Notes (Signed)
Patient ID: Kimberly Glass, female    DOB: September 15, 1985, 31 y.o.   MRN: [id number]  PCP: Porfirio Oar, PA-C  Chief Complaint  Patient presents with  . Hip Pain    per patient, needs to have an x-ray    Subjective:   Presents for evaluation of RIGHT hip pain.  I evaluated this at her last visit on 12/06/2016. She described pain in the RIGHT hip x 1 month that had begun as a sensation of her skin stretching in the area of both hips. That sensation resolved, and she had developed pain and tenderness along the RIGHT ASIS with weight bearing following sitting for a while or with standing first thing in the morning. The pain resolved with walking and was described as "tolerable," and "like a bruise." She had tried nothing to alleviate the pain.  It is notable that she has a history of LBP due to scoliosis and a leg length discrepancy for which she sees a Land. She generally does not wear the LEFT shoe lift because it irritates the bottom of her foot. We elected to start treatment with meloxicam and either RTC for radiographs or refer to ortho if the symptoms did not resolve.  Today she requests the radiographs.  Meloxicam hasn't helped much. During the Memorial Day weekend she was sitting less, and had less pain. Upon return to work this week, and resumption of sitting, the pain increased again.    Review of Systems As above.    Patient Active Problem List   Diagnosis Date Noted  . Pilonidal disease 01/11/2014  . Anxiety state, unspecified 06/22/2013  . BMI 40.0-44.9, adult (HCC) 06/22/2013  . Migraine 05/10/2013  . Endometriosis   . Irregular menses 07/30/2012  . Dysmenorrhea 07/30/2012  . Female pelvic pain 07/30/2012  . Vulvar lesion 07/30/2012  . Essential hypertension, benign      Prior to Admission medications   Medication Sig Start Date End Date Taking? Authorizing Provider  ALPRAZolam (XANAX) 0.5 MG tablet TAKE 1/2 TO 1 TABLET BY MOUTH THREE TIMES  DAILY AS NEEDED FOR ANXIETY 06/28/16   Porfirio Oar, PA-C  levonorgestrel (MIRENA) 20 MCG/24HR IUD 1 each by Intrauterine route once.    [provider]  lisinopril (PRINIVIL,ZESTRIL) 5 MG tablet Take 1 tablet (5 mg total) by mouth daily. 06/28/16   Porfirio Oar, PA-C  meloxicam (MOBIC) 15 MG tablet Take 1 tablet (15 mg total) by mouth daily. 12/06/16   Porfirio Oar, PA-C  Multiple Vitamins-Minerals (MULTIVITAMIN PO) Take by mouth.    [provider]  venlafaxine XR (EFFEXOR-XR) 75 MG 24 hr capsule Take 1 capsule (75 mg total) by mouth daily with breakfast. 06/28/16   Porfirio Oar, PA-C     Allergies  Allergen Reactions  . Diphenhydramine Other (See Comments)    Hyperactivity  . Topiramate Diarrhea  . Yasmin [Drospirenone-Ethinyl Estradiol] Other (See Comments)    Migraine Headache  . Penicillins Hives       Objective:  Physical Exam  Constitutional: She is oriented to person, place, and time. She appears well-developed and well-nourished. She is active and cooperative. No distress.  BP 124/87 (BP Location: Right Arm, Patient Position: Sitting, Cuff Size: Large)   Pulse 83   Temp 98.3 F (36.8 C) (Oral)   Resp 16   Ht 5' 9.5" (1.765 m)   Wt (!) 302 lb 6.4 oz (137.2 kg)   LMP 11/23/2016 (Approximate)   SpO2 98%   BMI 44.02 kg/m  Eyes: Conjunctivae are normal.  Pulmonary/Chest: Effort normal.  Musculoskeletal:       Right hip: She exhibits bony tenderness. She exhibits normal range of motion, normal strength, no tenderness, no swelling, no crepitus, no deformity and no laceration.       Legs: Neurological: She is alert and oriented to person, place, and time.  Psychiatric: She has a normal mood and affect. Her speech is normal and behavior is normal.     Dg Hip Unilat W Or W/o Pelvis 2-3 Views Right  Result Date: 12/19/2016 CLINICAL DATA:  Right hip joint pain for 1 month EXAM: DG HIP (WITH OR WITHOUT PELVIS) 2-3V RIGHT COMPARISON:  None.  FINDINGS: Pelvic ring is intact. An IUD is noted in place. The proximal femur is within normal limits. No acute fracture or dislocation is noted. IMPRESSION: No acute abnormality seen. Electronically Signed   By: Alcide CleverMark  Lukens M.D.   On: 12/19/2016 11:11       Assessment & Plan:   1. Pain of right hip joint COntinue meloxicam and increased activity. Refer for specialty evaluation. - DG HIP UNILAT W OR W/O PELVIS 2-3 VIEWS RIGHT; Future - Ambulatory referral to Orthopedic Surgery  2. BMI 40.0-44.9, adult (HCC) - Amb Ref to Medical Weight Management    Return for as previously scheduled.   Fernande Brashelle S. Meryl Ponder, PA-C Primary Care at [facility]

## 2016-12-19 NOTE — Patient Instructions (Signed)
     IF you received an x-ray today, you will receive an invoice from [facility]. Please contact [facility] at [phone] with questions or concerns regarding your invoice.   IF you received labwork today, you will receive an invoice from [facility]. Please contact [facility] at [phone] with questions or concerns regarding your invoice.   Our billing staff will not be able to assist you with questions regarding bills from these companies.  You will be contacted with the lab results as soon as they are available. The fastest way to get your results is to activate your My Chart account. Instructions are located on the last page of this paperwork. If you have not heard from us regarding the results in 2 weeks, please contact this office.     

## 2016-12-31 ENCOUNTER — Encounter: Payer: Self-pay | Admitting: Physician Assistant

## 2016-12-31 ENCOUNTER — Ambulatory Visit (INDEPENDENT_AMBULATORY_CARE_PROVIDER_SITE_OTHER): Payer: BLUE CROSS/BLUE SHIELD | Admitting: Physician Assistant

## 2016-12-31 VITALS — BP 127/86 | HR 89 | Temp 98.3°F | Ht 69.5 in | Wt 309.6 lb

## 2016-12-31 DIAGNOSIS — I10 Essential (primary) hypertension: Secondary | ICD-10-CM | POA: Diagnosis not present

## 2016-12-31 DIAGNOSIS — Z6841 Body Mass Index (BMI) 40.0 and over, adult: Secondary | ICD-10-CM | POA: Diagnosis not present

## 2016-12-31 DIAGNOSIS — E785 Hyperlipidemia, unspecified: Secondary | ICD-10-CM | POA: Diagnosis not present

## 2016-12-31 NOTE — Assessment & Plan Note (Signed)
Controlled. Continue current treatment. Plan to change regimen once she is ready to remove the IUD.

## 2016-12-31 NOTE — Assessment & Plan Note (Signed)
Proceed with Information Session with [facility].

## 2016-12-31 NOTE — Assessment & Plan Note (Signed)
Await labs. Not currently on medication.

## 2016-12-31 NOTE — Progress Notes (Signed)
   Subjective:    Patient ID: Kimberly Glass, female    DOB: 07/16/1986, 31 y.o.   MRN: [id number] PCP: Porfirio OarJeffery, Chelle, PA-C No chief complaint on file.   HPI : 31 y/o F presents today for follow up of hypertension. She is currently being treated with lisinopril.   Patient Active Problem List   Diagnosis Date Noted  . Pilonidal disease 01/11/2014  . Anxiety state, unspecified 06/22/2013  . BMI 40.0-44.9, adult (HCC) 06/22/2013  . Migraine 05/10/2013  . Endometriosis   . Irregular menses 07/30/2012  . Dysmenorrhea 07/30/2012  . Female pelvic pain 07/30/2012  . Vulvar lesion 07/30/2012  . Essential hypertension, benign    Past Medical History:  Diagnosis Date  . Acne vulgaris   . Allergic rhinitis, cause unspecified   . Anxiety   . Endometriosis dx Jan 2014  . Erythema nodosum    associated with menses; improved with OCP  . Essential hypertension, benign   . H/O pilonidal cyst   . Headache, migraine   . Obesity, unspecified    Prior to Admission medications   Medication Sig Start Date End Date Taking? Authorizing Provider  ALPRAZolam (XANAX) 0.5 MG tablet TAKE 1/2 TO 1 TABLET BY MOUTH THREE TIMES DAILY AS NEEDED FOR ANXIETY 06/28/16   Porfirio OarJeffery, Chelle, PA-C  levonorgestrel (MIRENA) 20 MCG/24HR IUD 1 each by Intrauterine route once.    [provider]  lisinopril (PRINIVIL,ZESTRIL) 5 MG tablet Take 1 tablet (5 mg total) by mouth daily. 06/28/16   Porfirio OarJeffery, Chelle, PA-C  meloxicam (MOBIC) 15 MG tablet Take 1 tablet (15 mg total) by mouth daily. 12/06/16   Porfirio OarJeffery, Chelle, PA-C  Multiple Vitamins-Minerals (MULTIVITAMIN PO) Take by mouth.    [provider]  venlafaxine XR (EFFEXOR-XR) 75 MG 24 hr capsule Take 1 capsule (75 mg total) by mouth daily with breakfast. 06/28/16   Porfirio OarJeffery, Chelle, PA-C   Allergies  Allergen Reactions  . Diphenhydramine Other (See Comments)    Hyperactivity  . Topiramate Diarrhea  . Yasmin [Drospirenone-Ethinyl Estradiol] Other (See  Comments)    Migraine Headache  . Penicillins Hives     Review of Systems     Objective:   Physical Exam        Assessment & Plan:

## 2016-12-31 NOTE — Patient Instructions (Signed)
     IF you received an x-ray today, you will receive an invoice from [facility]. Please contact [facility] at [phone] with questions or concerns regarding your invoice.   IF you received labwork today, you will receive an invoice from [facility]. Please contact [facility] at [phone] with questions or concerns regarding your invoice.   Our billing staff will not be able to assist you with questions regarding bills from these companies.  You will be contacted with the lab results as soon as they are available. The fastest way to get your results is to activate your My Chart account. Instructions are located on the last page of this paperwork. If you have not heard from us regarding the results in 2 weeks, please contact this office.     

## 2016-12-31 NOTE — Progress Notes (Signed)
Patient ID: Kimberly Glass, female    DOB: 23-Jun-1986, 31 y.o.   MRN: [id number]  PCP: Porfirio Oar, PA-C  Chief Complaint  Patient presents with  . Medication Refill    Lisinopril, venlafaxine hcl    Subjective:   Presents for medication refills/6 month follow-up.  I have recently seen her for RIGHT hip pain.  Not fasting today. Waiting list for information session at [facility].  Sees ortho next week regarding the RIGHT hip pain. Not worsening. Changing her desk chair to an exercise ball has helped a little.  Current medications are working well for her, without adverse effects. Upon chart review, we identify that she has refills of both lisinopril and venlafaxine at the pharmacy. Not planning on trying to conceive in the next 6 months. IUD is in place..    Review of Systems  Constitutional: Negative.   HENT: Negative for sore throat.   Eyes: Negative for visual disturbance.  Respiratory: Negative for cough, chest tightness, shortness of breath and wheezing.   Cardiovascular: Negative for chest pain and palpitations.  Gastrointestinal: Negative for abdominal pain, diarrhea, nausea and vomiting.  Genitourinary: Negative for dysuria, frequency, hematuria and urgency.  Musculoskeletal: Positive for arthralgias (RIGHT hip pain). Negative for myalgias.  Skin: Negative for rash.  Neurological: Negative for dizziness, weakness and headaches.  Psychiatric/Behavioral: Negative for decreased concentration. The patient is not nervous/anxious.     Patient Active Problem List   Diagnosis Date Noted  . Hyperlipidemia 12/31/2016  . Pilonidal disease 01/11/2014  . Anxiety state, unspecified 06/22/2013  . BMI 45.0-49.9, adult (HCC) 06/22/2013  . Migraine 05/10/2013  . Endometriosis   . Irregular menses 07/30/2012  . Dysmenorrhea 07/30/2012  . Female pelvic pain 07/30/2012  . Vulvar lesion 07/30/2012  . Essential hypertension, benign       Prior to Admission medications   Medication Sig Start Date End Date Taking? Authorizing Provider  ALPRAZolam (XANAX) 0.5 MG tablet TAKE 1/2 TO 1 TABLET BY MOUTH THREE TIMES DAILY AS NEEDED FOR ANXIETY 06/28/16   Porfirio Oar, PA-C  levonorgestrel (MIRENA) 20 MCG/24HR IUD 1 each by Intrauterine route once.    [provider]  lisinopril (PRINIVIL,ZESTRIL) 5 MG tablet Take 1 tablet (5 mg total) by mouth daily. 06/28/16   Porfirio Oar, PA-C  meloxicam (MOBIC) 15 MG tablet Take 1 tablet (15 mg total) by mouth daily. 12/06/16   Porfirio Oar, PA-C  Multiple Vitamins-Minerals (MULTIVITAMIN PO) Take by mouth.    [provider]  venlafaxine XR (EFFEXOR-XR) 75 MG 24 hr capsule Take 1 capsule (75 mg total) by mouth daily with breakfast. 06/28/16   Porfirio Oar, PA-C     Allergies  Allergen Reactions  . Diphenhydramine Other (See Comments)    Hyperactivity  . Topiramate Diarrhea  . Yasmin [Drospirenone-Ethinyl Estradiol] Other (See Comments)    Migraine Headache  . Penicillins Hives       Objective:  Physical Exam  Constitutional: She is oriented to person, place, and time. She appears well-developed and well-nourished. She is active and cooperative. No distress.  BP 127/86   Pulse 89   Temp 98.3 F (36.8 C) (Oral)   Ht 5' 9.5" (1.765 m)   Wt (!) 309 lb 9.6 oz (140.4 kg)   LMP 12/24/2016   SpO2 99%   BMI 45.06 kg/m   HENT:  Head: Normocephalic and atraumatic.  Right Ear: Hearing normal.  Left Ear: Hearing normal.  Eyes: Conjunctivae are normal. No scleral  icterus.  Neck: Normal range of motion. Neck supple. No thyromegaly present.  Cardiovascular: Normal rate, regular rhythm and normal heart sounds.   Pulses:      Radial pulses are 2+ on the right side, and 2+ on the left side.  Pulmonary/Chest: Effort normal and breath sounds normal.  Lymphadenopathy:       Head (right side): No tonsillar, no preauricular, no posterior auricular and no occipital  adenopathy present.       Head (left side): No tonsillar, no preauricular, no posterior auricular and no occipital adenopathy present.    She has no cervical adenopathy.       Right: No supraclavicular adenopathy present.       Left: No supraclavicular adenopathy present.  Neurological: She is alert and oriented to person, place, and time. No sensory deficit.  Skin: Skin is warm, dry and intact. No rash noted. No cyanosis or erythema. Nails show no clubbing.  Psychiatric: She has a normal mood and affect. Her speech is normal and behavior is normal.    Wt Readings from Last 3 Encounters:  12/31/16 (!) 309 lb 9.6 oz (140.4 kg)  12/19/16 (!) 302 lb 6.4 oz (137.2 kg)  12/06/16 299 lb 12.8 oz (136 kg)       Assessment & Plan:   Problem List Items Addressed This Visit    Essential hypertension, benign - Primary    Controlled. Continue current treatment. Plan to change regimen once she is ready to remove the IUD.      Relevant Orders   CBC with Differential/Platelet   TSH   T4, free   Comprehensive metabolic panel   BMI 45.0-49.9, adult (HCC)    Proceed with Information Session with [facility].      Hyperlipidemia    Await labs. Not currently on medication.      Relevant Orders   Lipid panel       Return in about 6 months (around 07/02/2017) for re-evaluation of blood pressure and cholesterol.   Fernande Brashelle S. Bellatrix Devonshire, PA-C Primary Care at [facility]

## 2017-01-01 LAB — COMPREHENSIVE METABOLIC PANEL
A/G RATIO: 1.6 (ref 1.2–2.2)
ALBUMIN: 4.4 g/dL (ref 3.5–5.5)
ALK PHOS: 70 IU/L (ref 39–117)
ALT: 16 IU/L (ref 0–32)
AST: 9 IU/L (ref 0–40)
BUN / CREAT RATIO: 20 (ref 9–23)
BUN: 11 mg/dL (ref 6–20)
Bilirubin Total: 0.3 mg/dL (ref 0.0–1.2)
CALCIUM: 9.7 mg/dL (ref 8.7–10.2)
CO2: 23 mmol/L (ref 20–29)
CREATININE: 0.55 mg/dL — AB (ref 0.57–1.00)
Chloride: 103 mmol/L (ref 96–106)
GFR calc Af Amer: 144 mL/min/{1.73_m2} (ref >59)
GFR, EST NON AFRICAN AMERICAN: 125 mL/min/{1.73_m2} (ref >59)
GLOBULIN, TOTAL: 2.7 g/dL (ref 1.5–4.5)
Glucose: 100 mg/dL — ABNORMAL HIGH (ref 65–99)
POTASSIUM: 4.6 mmol/L (ref 3.5–5.2)
SODIUM: 141 mmol/L (ref 134–144)
Total Protein: 7.1 g/dL (ref 6.0–8.5)

## 2017-01-01 LAB — CBC WITH DIFFERENTIAL/PLATELET
BASOS: 1 %
Basophils Absolute: 0 10*3/uL (ref 0.0–0.2)
EOS (ABSOLUTE): 0.1 10*3/uL (ref 0.0–0.4)
EOS: 2 %
HEMATOCRIT: 40 % (ref 34.0–46.6)
Hemoglobin: 13.6 g/dL (ref 11.1–15.9)
IMMATURE GRANS (ABS): 0 10*3/uL (ref 0.0–0.1)
IMMATURE GRANULOCYTES: 0 %
LYMPHS: 29 %
Lymphocytes Absolute: 2 10*3/uL (ref 0.7–3.1)
MCH: 30.3 pg (ref 26.6–33.0)
MCHC: 34 g/dL (ref 31.5–35.7)
MCV: 89 fL (ref 79–97)
MONOS ABS: 0.3 10*3/uL (ref 0.1–0.9)
Monocytes: 5 %
NEUTROS PCT: 63 %
Neutrophils Absolute: 4.3 10*3/uL (ref 1.4–7.0)
PLATELETS: 464 10*3/uL — AB (ref 150–379)
RBC: 4.49 x10E6/uL (ref 3.77–5.28)
RDW: 13.8 % (ref 12.3–15.4)
WBC: 6.8 10*3/uL (ref 3.4–10.8)

## 2017-01-01 LAB — TSH: TSH: 1.23 u[IU]/mL (ref 0.450–4.500)

## 2017-01-01 LAB — T4, FREE: Free T4: 0.99 ng/dL (ref 0.82–1.77)

## 2017-01-01 LAB — LIPID PANEL
CHOL/HDL RATIO: 6.5 ratio — AB (ref 0.0–4.4)
Cholesterol, Total: 216 mg/dL — ABNORMAL HIGH (ref 100–199)
HDL: 33 mg/dL — AB (ref >39)
LDL Calculated: 105 mg/dL — ABNORMAL HIGH (ref 0–99)
Triglycerides: 390 mg/dL — ABNORMAL HIGH (ref 0–149)
VLDL CHOLESTEROL CAL: 78 mg/dL — AB (ref 5–40)

## 2017-01-09 ENCOUNTER — Encounter (INDEPENDENT_AMBULATORY_CARE_PROVIDER_SITE_OTHER): Payer: Self-pay | Admitting: Orthopedic Surgery

## 2017-01-09 ENCOUNTER — Ambulatory Visit (INDEPENDENT_AMBULATORY_CARE_PROVIDER_SITE_OTHER): Payer: BLUE CROSS/BLUE SHIELD | Admitting: Orthopedic Surgery

## 2017-01-09 DIAGNOSIS — M25551 Pain in right hip: Secondary | ICD-10-CM

## 2017-01-09 MED ORDER — IBUPROFEN-FAMOTIDINE 800-26.6 MG PO TABS: 1.0000 | ORAL_TABLET | Freq: Two times a day (BID) | ORAL | 0 refills | Status: DC

## 2017-01-09 NOTE — Progress Notes (Signed)
Office Visit Note   Patient: Kimberly Glass           Date of Birth: May 22, 1986           MRN: [id number] Visit Date: 01/09/2017 Requested by: Porfirio Oar, PA-C [address] [place], [place] [address] PCP: Porfirio Oar, PA-C  Subjective: Chief Complaint  Patient presents with  . Right Hip - Pain    HPI: Patient is a 31 year old female with right hip pain which is primarily along the anterior superior iliac crest.  Then an injury.  Been going on for 2 months.  She tried Mobic without relief.  She reports pain only to touch or with prolonged sitting.  She denies any history of injury.  Denies any groin pain or radicular pain.  Outside radiographs are reviewed.  They're essentially normal for bony evaluation.  She does have a history of scoliosis which did not require surgery.  She reports the pain is constant and mild.  Exercise ball helps.  She can sleep on that side.              ROS: All systems reviewed are negative as they relate to the chief complaint within the history of present illness.  Patient denies  fevers or chills.   Assessment & Plan: Visit Diagnoses:  1. Pain of right hip joint     Plan: Impression is right hip pain localizing to the anterior superior iliac crest.  No masses palpable in this region radiographs are normal.,  Try her on Duexis for a month and no anti-inflammatories for month return in 2 months for clinical recheck.  May need to perform imaging of the pelvis at that time such as an MRI scan to really look at soft tissues more.  Follow-Up Instructions: Return in about 8 weeks (around 03/06/2017).   Orders:  No orders of the defined types were placed in this encounter.  Meds ordered this encounter  Medications  . Ibuprofen-Famotidine (DUEXIS) 800-26.6 MG TABS    Sig: Take 1 tablet by mouth 2 (two) times daily.    Dispense:  60 tablet    Refill:  0      Procedures: No procedures performed   Clinical Data: No additional  findings.  Objective: Vital Signs: LMP 12/24/2016   Physical Exam:   Constitutional: Patient appears well-developed HEENT:  Head: Normocephalic Eyes:EOM are normal Neck: Normal range of motion Cardiovascular: Normal rate Pulmonary/chest: Effort normal Neurologic: Patient is alert Skin: Skin is warm Psychiatric: Patient has normal mood and affect    Ortho Exam: Orthopedic exam demonstrates pretty normal gait alignment that is she is getting up and out of the chair she has not much pain with lateral bending.  There is no trochanteric tenderness.  She does have discrete tenderness over the anterior superior iliac crest extending proximally about 5-6 cm.  No masses lymph adenopathy or skin changes noted in this area on the right-hand side.  No weakness with hip flexion.  Specialty Comments:  No specialty comments available.  Imaging: No results found.   PMFS History: Patient Active Problem List   Diagnosis Date Noted  . Hyperlipidemia 12/31/2016  . Pilonidal disease 01/11/2014  . Anxiety state, unspecified 06/22/2013  . BMI 45.0-49.9, adult (HCC) 06/22/2013  . Migraine 05/10/2013  . Endometriosis   . Irregular menses 07/30/2012  . Dysmenorrhea 07/30/2012  . Female pelvic pain 07/30/2012  . Vulvar lesion 07/30/2012  . Essential hypertension, benign    Past Medical History:  Diagnosis Date  .  Acne vulgaris   . Allergic rhinitis, cause unspecified   . Anxiety   . Endometriosis dx Jan 2014  . Erythema nodosum    associated with menses; improved with OCP  . Essential hypertension, benign   . H/O pilonidal cyst   . Headache, migraine   . Obesity, unspecified     Family History  Problem Relation Age of Onset  . Heart disease Father [id number]       AMI; stents  . Hypertension Father   . Hyperlipidemia Father   . Diabetes Maternal Grandmother   . Endometriosis Maternal Grandmother   . Cancer Mother        kidney  . Endometriosis Mother   . Cancer Maternal Grandfather    . Stroke Paternal Grandfather   . Hypertension Paternal Grandfather   . Hyperlipidemia Paternal Grandfather   . Heart disease Paternal Grandfather     Past Surgical History:  Procedure Laterality Date  . OPEN REDUCTION INTERNAL FIXATION (ORIF) METACARPAL Right 10/28/2014   Procedure: OPEN REDUCTION INTERNAL FIXATION RIGHT FINGER METACARPAL FRACTURE;  Surgeon: Dairl PonderMatthew Weingold, MD;  Location: [facility] OR;  Service: Orthopedics;  Laterality: Right;  . ORIF RADIAL FRACTURE Right 10/28/2014   Procedure: OPEN REDUCTION INTERNAL FIXATION RIGHT RADIUS FRACTURE;  Surgeon: Dairl PonderMatthew Weingold, MD;  Location: [facility] OR;  Service: Orthopedics;  Laterality: Right;  . ORIF SCAPHOID FRACTURE Right 10/28/2014   Procedure: OPEN REDUCTION INTERNAL FIXATION (ORIF) SCAPHOID FRACTURE;  Surgeon: Dairl PonderMatthew Weingold, MD;  Location: [facility] OR;  Service: Orthopedics;  Laterality: Right;  . PILONIDAL CYST EXCISION  01/30/2015   Ardeth SportsmanSteven C. Gross, MD  . WISDOM TOOTH EXTRACTION     Social History   Occupational History  . 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 Smoking status: Never Smoker  . Smokeless tobacco: Never Used  . Alcohol use 0.6 oz/week    1 Cans of beer per week     Comment: rarely   . Drug use: No  . Sexual activity: Yes    Partners: Male    Birth control/ protection: IUD

## 2017-02-07 ENCOUNTER — Telehealth (INDEPENDENT_AMBULATORY_CARE_PROVIDER_SITE_OTHER): Payer: Self-pay | Admitting: Orthopedic Surgery

## 2017-02-07 ENCOUNTER — Other Ambulatory Visit (INDEPENDENT_AMBULATORY_CARE_PROVIDER_SITE_OTHER): Payer: Self-pay | Admitting: Orthopedic Surgery

## 2017-02-07 DIAGNOSIS — M25552 Pain in left hip: Secondary | ICD-10-CM

## 2017-02-07 DIAGNOSIS — M25551 Pain in right hip: Secondary | ICD-10-CM

## 2017-02-07 NOTE — Telephone Encounter (Signed)
Patient called asked if she can be set up for an MRI. The number to contact patient is [phone]

## 2017-02-07 NOTE — Telephone Encounter (Signed)
Patient wanted to proceed with scan. I ordered pelvis MRI per your last dictation.  IC LMVM for patient advising order submitted.

## 2017-02-07 NOTE — Telephone Encounter (Signed)
thx

## 2017-02-21 ENCOUNTER — Ambulatory Visit
Admission: RE | Admit: 2017-02-21 | Discharge: 2017-02-21 | Disposition: A | Discharge status: Home / Self Care | Payer: BLUE CROSS/BLUE SHIELD | Source: Ambulatory Visit | Attending: Orthopedic Surgery | Admitting: Orthopedic Surgery

## 2017-02-21 DIAGNOSIS — M25551 Pain in right hip: Secondary | ICD-10-CM

## 2017-02-21 DIAGNOSIS — M25552 Pain in left hip: Principal | ICD-10-CM

## 2017-03-03 ENCOUNTER — Encounter: Payer: Self-pay | Admitting: Family Medicine

## 2017-03-03 ENCOUNTER — Ambulatory Visit (INDEPENDENT_AMBULATORY_CARE_PROVIDER_SITE_OTHER): Payer: BLUE CROSS/BLUE SHIELD | Admitting: Family Medicine

## 2017-03-03 VITALS — BP 128/85 | HR 87 | Temp 97.9°F | Resp 18 | Ht 70.08 in | Wt 305.0 lb

## 2017-03-03 DIAGNOSIS — R05 Cough: Secondary | ICD-10-CM

## 2017-03-03 DIAGNOSIS — R059 Cough, unspecified: Secondary | ICD-10-CM

## 2017-03-03 DIAGNOSIS — J069 Acute upper respiratory infection, unspecified: Secondary | ICD-10-CM | POA: Diagnosis not present

## 2017-03-03 MED ORDER — BENZONATATE 100 MG PO CAPS: 100.0000 mg | ORAL_CAPSULE | Freq: Three times a day (TID) | ORAL | 0 refills | Status: DC | PRN

## 2017-03-03 NOTE — Progress Notes (Signed)
Patient ID: Kimberly Glass, female    DOB: [birth year]  Age: 31 y.o. MRN: [id number]  Chief Complaint  Patient presents with  . Nasal Congestion    with some cough and ear pain x 4 days   . Headache    Subjective:   31 [facility]. Her parents who were on vacation were sick. She developed a respiratory tract infection Friday with sore throat, painful glands in her neck, some ear pain, and now her cough. She does not smoke. She is generally fairly healthy. She has been taking plain Mucinex and ibuprofen. She works for [employer] but does not work excuse.  Current allergies, medications, problem list, past/family and social histories reviewed.  Objective:  BP 128/85   Pulse 87   Temp 97.9 F (36.6 C) (Oral)   Resp 18   Ht 5' 10.08" (1.78 m)   Wt (!) 305 lb (138.3 kg)   LMP 02/18/2017 (Exact Date)   SpO2 98%   BMI 43.66 kg/m   Pleasant lady, overweight, no acute distress. TMs normal. Throat not erythematous but looks a little edematous. Neck supple with mildly enlarged and tender submandibular nodes. Chest is clear to auscultation. Heart regular without murmur.  Assessment & Plan:   Assessment: 1. Acute upper respiratory infection   2. Cough       Plan: See instructions. Kimberly Glass is her regular provider  No orders of the defined types were placed in this encounter.   Meds ordered this encounter  Medications  . benzonatate (TESSALON) 100 MG capsule    Sig: Take 1-2 capsules (100-200 mg total) by mouth 3 (three) times daily as needed.    Dispense:  30 capsule    Refill:  0         Patient Instructions   Drink plenty of fluids and get enough rest  Continue using the Mucinex to help thin the secretions  Use your Flonase 2 sprays each nostril twice daily for 3 days, then reduce to using it once daily. Use as directed.  Take Allegra-D or Claritin-D  Use the benzonatate (Tessalon) cough pills one or 2 pills 3  times daily as needed for cough  If you're getting worse such as developing wheezing, fevers, a lot more purulent mucus, shortness of breath, or generally feeling worse please return      IF you received an x-ray today, you will receive an invoice from [facility]. Please contact [facility] at [phone] with questions or concerns regarding your invoice.   IF you received labwork today, you will receive an invoice from [facility]. Please contact [facility] at [phone] with questions or concerns regarding your invoice.   Our billing staff will not be able to assist you with questions regarding bills from these companies.  You will be contacted with the lab results as soon as they are available. The fastest way to get your results is to activate your My Chart account. Instructions are located on the last page of this paperwork. If you have not heard from us regarding the results in 2 weeks, please contact this office.         Return if symptoms worsen or fail to improve.   HOPPER,DAVID, MD 03/03/2017

## 2017-03-03 NOTE — Patient Instructions (Addendum)
Drink plenty of fluids and get enough rest  Continue using the Mucinex to help thin the secretions  Use your Flonase 2 sprays each nostril twice daily for 3 days, then reduce to using it once daily. Use as directed.  Take Allegra-D or Claritin-D  Use the benzonatate (Tessalon) cough pills one or 2 pills 3 times daily as needed for cough  If you're getting worse such as developing wheezing, fevers, a lot more purulent mucus, shortness of breath, or generally feeling worse please return      IF you received an x-ray today, you will receive an invoice from [facility]. Please contact [facility] at [phone] with questions or concerns regarding your invoice.   IF you received labwork today, you will receive an invoice from [facility]. Please contact [facility] at [phone] with questions or concerns regarding your invoice.   Our billing staff will not be able to assist you with questions regarding bills from these companies.  You will be contacted with the lab results as soon as they are available. The fastest way to get your results is to activate your My Chart account. Instructions are located on the last page of this paperwork. If you have not heard from us regarding the results in 2 weeks, please contact this office.

## 2017-03-07 ENCOUNTER — Ambulatory Visit (INDEPENDENT_AMBULATORY_CARE_PROVIDER_SITE_OTHER): Payer: BLUE CROSS/BLUE SHIELD | Admitting: Physician Assistant

## 2017-03-07 ENCOUNTER — Encounter: Payer: Self-pay | Admitting: Physician Assistant

## 2017-03-07 VITALS — BP 136/88 | HR 90 | Temp 98.9°F | Resp 16 | Ht 69.5 in | Wt 305.4 lb

## 2017-03-07 DIAGNOSIS — J029 Acute pharyngitis, unspecified: Secondary | ICD-10-CM | POA: Diagnosis not present

## 2017-03-07 DIAGNOSIS — H9201 Otalgia, right ear: Secondary | ICD-10-CM

## 2017-03-07 DIAGNOSIS — R059 Cough, unspecified: Secondary | ICD-10-CM

## 2017-03-07 DIAGNOSIS — H6691 Otitis media, unspecified, right ear: Secondary | ICD-10-CM

## 2017-03-07 DIAGNOSIS — R05 Cough: Secondary | ICD-10-CM | POA: Diagnosis not present

## 2017-03-07 MED ORDER — HYDROCODONE-HOMATROPINE 5-1.5 MG/5ML PO SYRP: 5.0000 mL | ORAL_SOLUTION | Freq: Three times a day (TID) | ORAL | 0 refills | Status: DC | PRN

## 2017-03-07 MED ORDER — CEFDINIR 300 MG PO CAPS: 600.0000 mg | ORAL_CAPSULE | Freq: Every day | ORAL | 0 refills | Status: AC

## 2017-03-07 NOTE — Progress Notes (Signed)
Kimberly Glass  MRN: [id number] DOB: [birth year]  PCP: Kimberly Oar, PA-C  Subjective:  Pt is a 31 year old female who presents to clinic for f/u illness.  She was here about 5 days ago and treated for a URI with tessalon pearls. Her condition has since worsened. C/o cough, sore throat and ear pain. C/o left ear pain and sore throat. This is keeping her up at night. Denies fever, chills, n/v, shob, wheezing, ear drainage, tinnitus.   Review of Systems  Constitutional: Positive for fatigue. Negative for chills, diaphoresis and fever.  HENT: Positive for ear pain and sore throat. Negative for congestion, ear discharge, postnasal drip, rhinorrhea, sinus pain, sinus pressure, trouble swallowing and voice change.   Respiratory: Positive for cough. Negative for chest tightness, shortness of breath and wheezing.   Cardiovascular: Negative for chest pain and palpitations.  Gastrointestinal: Negative for abdominal pain, constipation and nausea.  Neurological: Positive for headaches. Negative for dizziness.  Psychiatric/Behavioral: Positive for sleep disturbance.    Patient Active Problem List   Diagnosis Date Noted  . Hyperlipidemia 12/31/2016  . Pilonidal disease 01/11/2014  . Anxiety state, unspecified 06/22/2013  . BMI 45.0-49.9, adult (HCC) 06/22/2013  . Migraine 05/10/2013  . Endometriosis   . Irregular menses 07/30/2012  . Dysmenorrhea 07/30/2012  . Female pelvic pain 07/30/2012  . Vulvar lesion 07/30/2012  . Essential hypertension, benign     Current Outpatient Prescriptions on File Prior to Visit  Medication Sig Dispense Refill  . ALPRAZolam (XANAX) 0.5 MG tablet TAKE 1/2 TO 1 TABLET BY MOUTH THREE TIMES DAILY AS NEEDED FOR ANXIETY 30 tablet 0  . benzonatate (TESSALON) 100 MG capsule Take 1-2 capsules (100-200 mg total) by mouth 3 (three) times daily as needed. 30 capsule 0  . Ibuprofen-Famotidine (DUEXIS) 800-26.6 MG TABS Take 1 tablet by mouth 2 (two) times daily. 60  tablet 0  . levonorgestrel (MIRENA) 20 MCG/24HR IUD 1 each by Intrauterine route once.    Marland Kitchen lisinopril (PRINIVIL,ZESTRIL) 5 MG tablet Take 1 tablet (5 mg total) by mouth daily. 90 tablet 3  . Multiple Vitamins-Minerals (MULTIVITAMIN PO) Take by mouth.    . venlafaxine XR (EFFEXOR-XR) 75 MG 24 hr capsule Take 1 capsule (75 mg total) by mouth daily with breakfast. 90 capsule 3   No current facility-administered medications on file prior to visit.     Allergies  Allergen Reactions  . Diphenhydramine Other (See Comments)    Hyperactivity  . Topiramate Diarrhea  . Yasmin [Drospirenone-Ethinyl Estradiol] Other (See Comments)    Migraine Headache  . Penicillins Hives     Objective:  BP 136/88   Pulse 90   Temp 98.9 F (37.2 C) (Oral)   Resp 16   Ht 5' 9.5" (1.765 m)   Wt (!) 305 lb 6.4 oz (138.5 kg)   LMP 02/18/2017 (Exact Date)   SpO2 98%   BMI 44.45 kg/m   Physical Exam  Constitutional: She is oriented to person, place, and time and well-developed, well-nourished, and in no distress. No distress.  HENT:  Right Ear: No drainage. No mastoid tenderness. Tympanic membrane is bulging. A middle ear effusion is present.  Left Ear: Tympanic membrane normal.  Mouth/Throat: Oropharynx is clear and moist and mucous membranes are normal.  Cardiovascular: Normal rate, regular rhythm and normal heart sounds.   Neurological: She is alert and oriented to person, place, and time. GCS score is 15.  Skin: Skin is warm and dry.  Psychiatric: Mood, memory, affect and judgment  normal.  Vitals reviewed.   Assessment and Plan :  1. Right otitis media, unspecified otitis media type 2. Right ear pain 3. Sore throat - cefdinir (OMNICEF) 300 MG capsule; Take 2 capsules (600 mg total) by mouth daily.  Dispense: 14 capsule; Refill: 0 - Supportive care encouraged. RTC in 5-7 days if no improvement. Consider imaging at that time.  4. Cough - HYDROcodone-homatropine (HYCODAN) 5-1.5 MG/5ML syrup; Take 5  mLs by mouth every 8 (eight) hours as needed for cough.  Dispense: 120 mL; Refill: 0   Kimberly Cincere Zorn, PA-C  Primary Care at [facility] 03/07/2017 12:10 PM

## 2017-03-07 NOTE — Patient Instructions (Addendum)
Take the entire course of your antibiotics, even if you start to feel better.  Come back in 5-7 days if you are not improving.   Thank you for PCP in today. I hope you feel we met your needs.  Feel free to call [facility] if you have any questions or further requests.  Please consider signing up for MyChart if you do not already have it, as this is a great way to communicate with me.  Best,  Whitney McVey, PA-C   Otitis Media, Adult Otitis media is redness, soreness, and puffiness (swelling) in the space just behind your eardrum (middle ear). It may be caused by allergies or infection. It often happens along with a cold. Follow these instructions at home:  Take your medicine as told. Finish it even if you start to feel better.  Only take over-the-counter or prescription medicines for pain, discomfort, or fever as told by your doctor.  Follow up with your doctor as told. Contact a doctor if:  You have otitis media only in one ear, or bleeding from your nose, or both.  You notice a lump on your neck.  You are not getting better in 3-5 days.  You feel worse instead of better. Get help right away if:  You have pain that is not helped with medicine.  You have puffiness, redness, or pain around your ear.  You get a stiff neck.  You cannot move part of your face (paralysis).  You notice that the bone behind your ear hurts when you touch it. This information is not intended to replace advice given to you by your health care provider. Make sure you discuss any questions you have with your health care provider. Document Released: 12/25/2007 Document Revised: 12/14/2015 Document Reviewed: 02/02/2013 Elsevier Interactive Patient Education  2017 [employer].    IF you received an x-ray today, you will receive an invoice from [facility]. Please contact [facility] at [phone] with questions or concerns regarding your invoice.   IF you received labwork today, you  will receive an invoice from [place]. Please contact [facility] at [phone] with questions or concerns regarding your invoice.   Our billing staff will not be able to assist you with questions regarding bills from these companies.  You will be contacted with the lab results as soon as they are available. The fastest way to get your results is to activate your My Chart account. Instructions are located on the last page of this paperwork. If you have not heard from [place] regarding the results in 2 weeks, please contact this office.

## 2017-03-12 ENCOUNTER — Encounter (INDEPENDENT_AMBULATORY_CARE_PROVIDER_SITE_OTHER): Payer: Self-pay | Admitting: Orthopedic Surgery

## 2017-03-12 ENCOUNTER — Ambulatory Visit (INDEPENDENT_AMBULATORY_CARE_PROVIDER_SITE_OTHER): Payer: BLUE CROSS/BLUE SHIELD | Admitting: Orthopedic Surgery

## 2017-03-12 DIAGNOSIS — M25551 Pain in right hip: Secondary | ICD-10-CM | POA: Diagnosis not present

## 2017-03-12 DIAGNOSIS — M25552 Pain in left hip: Secondary | ICD-10-CM

## 2017-03-13 NOTE — Progress Notes (Signed)
Office Visit Note   Patient: Kimberly Glass           Date of Birth: 11-22-1985           MRN: [id number] Visit Date: 03/12/2017 Requested by: Porfirio Oar, PA-C [address] [place], [place] [address] PCP: Porfirio Oar, PA-C  Subjective: Chief Complaint  Patient presents with  . Right Hip - Follow-up, Pain  . Left Hip - Follow-up, Pain    HPI: Patient is a 31 year old female with bilateral hip pain.  Since of [place] she's had an MRI scan which shows really no significant abnormalities of the right hip or the left hip particularly in relation to the anterior superior iliac crest.  Since I states that her symptoms are a little bit better.  She does describe having some swelling around her body in general when she flew to [place].              ROS: All systems reviewed are negative as they relate to the chief complaint within the history of present illness.  Patient denies  fevers or chills.   Assessment & Plan: Visit Diagnoses:  1. Bilateral hip pain     Plan: Impression is anterior superior iliac crest pain with no abnormalities on plain radiographs or MRI scan.  I think this is a strain pattern of pain with no real radicular nature or pattern coming from the back.  Should be a self-limited problem.  Conservative treatment indicated.  Follow-up with me as needed needed.  Follow-Up Instructions: Return if symptoms worsen or fail to improve.   Orders:  No orders of the defined types were placed in this encounter.  No orders of the defined types were placed in this encounter.     Procedures: No procedures performed   Clinical Data: No additional findings.  Objective: Vital Signs: LMP 02/18/2017 (Exact Date)   Physical Exam:   Constitutional: Patient appears well-developed HEENT:  Head: Normocephalic Eyes:EOM are normal Neck: Normal range of motion Cardiovascular: Normal rate Pulmonary/chest: Effort normal Neurologic: Patient is alert Skin: Skin is  warm Psychiatric: Patient has normal mood and affect    Ortho Exam: Orthopedic exam demonstrates good hip flexion abduction and adduction strength with good range of motion.  Does have a little tenderness to the anterior superior iliac crest to direct palpation.  No other masses lymph adenopathy or skin changes noted in the right hip region.  Specialty Comments:  No specialty comments available.  Imaging: No results found.   PMFS History: Patient Active Problem List   Diagnosis Date Noted  . Bilateral hip pain 03/12/2017  . Hyperlipidemia 12/31/2016  . Pilonidal disease 01/11/2014  . Anxiety state, unspecified 06/22/2013  . BMI 45.0-49.9, adult (HCC) 06/22/2013  . Migraine 05/10/2013  . Endometriosis   . Irregular menses 07/30/2012  . Dysmenorrhea 07/30/2012  . Female pelvic pain 07/30/2012  . Vulvar lesion 07/30/2012  . Essential hypertension, benign    Past Medical History:  Diagnosis Date  . Acne vulgaris   . Allergic rhinitis, cause unspecified   . Anxiety   . Endometriosis dx Jan 2014  . Erythema nodosum    associated with menses; improved with OCP  . Essential hypertension, benign   . H/O pilonidal cyst   . Headache, migraine   . Obesity, unspecified     Family History  Problem Relation Age of Onset  . Heart disease Father 15       AMI; stents  . Hypertension Father   .  Hyperlipidemia Father   . Diabetes Maternal Grandmother   . Endometriosis Maternal Grandmother   . Cancer Mother        kidney  . Endometriosis Mother   . Cancer Maternal Grandfather   . Stroke Paternal Grandfather   . Hypertension Paternal Grandfather   . Hyperlipidemia Paternal Grandfather   . Heart disease Paternal Grandfather     Past Surgical History:  Procedure Laterality Date  . OPEN REDUCTION INTERNAL FIXATION (ORIF) METACARPAL Right 10/28/2014   Procedure: OPEN REDUCTION INTERNAL FIXATION RIGHT FINGER METACARPAL FRACTURE;  Surgeon: Dairl Ponder, MD;  Location: [facility] OR;   Service: Orthopedics;  Laterality: Right;  . ORIF RADIAL FRACTURE Right 10/28/2014   Procedure: OPEN REDUCTION INTERNAL FIXATION RIGHT RADIUS FRACTURE;  Surgeon: Dairl Ponder, MD;  Location: [facility] OR;  Service: Orthopedics;  Laterality: Right;  . ORIF SCAPHOID FRACTURE Right 10/28/2014   Procedure: OPEN REDUCTION INTERNAL FIXATION (ORIF) SCAPHOID FRACTURE;  Surgeon: Dairl Ponder, MD;  Location: [facility] OR;  Service: Orthopedics;  Laterality: Right;  . PILONIDAL CYST EXCISION  01/30/2015   Ardeth Sportsman, MD  . WISDOM TOOTH EXTRACTION     Social History   Occupational History  . 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 Smoking status: Never Smoker  . Smokeless tobacco: Never Used  . Alcohol use 0.6 oz/week    1 Cans of beer per week     Comment: rarely   . Drug use: No  . Sexual activity: Yes    Partners: Male    Birth control/ protection: IUD

## 2017-03-21 ENCOUNTER — Ambulatory Visit (INDEPENDENT_AMBULATORY_CARE_PROVIDER_SITE_OTHER): Payer: BLUE CROSS/BLUE SHIELD | Admitting: Physician Assistant

## 2017-03-21 ENCOUNTER — Encounter: Payer: Self-pay | Admitting: Physician Assistant

## 2017-03-21 VITALS — BP 120/87 | HR 88 | Temp 98.1°F | Resp 18 | Ht 69.5 in | Wt 304.2 lb

## 2017-03-21 DIAGNOSIS — J069 Acute upper respiratory infection, unspecified: Secondary | ICD-10-CM | POA: Diagnosis not present

## 2017-03-21 DIAGNOSIS — H9201 Otalgia, right ear: Secondary | ICD-10-CM

## 2017-03-21 DIAGNOSIS — F419 Anxiety disorder, unspecified: Secondary | ICD-10-CM

## 2017-03-21 MED ORDER — ALPRAZOLAM 0.5 MG PO TABS: ORAL_TABLET | ORAL | 0 refills | Status: DC

## 2017-03-21 MED ORDER — AZELASTINE HCL 0.1 % NA SOLN: 2.0000 | Freq: Two times a day (BID) | NASAL | 0 refills | Status: DC

## 2017-03-21 MED ORDER — PREDNISONE 20 MG PO TABS: ORAL_TABLET | ORAL | 0 refills | Status: DC

## 2017-03-21 NOTE — Patient Instructions (Addendum)
Get plenty of rest and drink at least 64 ounces of water daily.    IF you received an x-ray today, you will receive an invoice from [facility]. Please contact [facility] at [phone] with questions or concerns regarding your invoice.   IF you received labwork today, you will receive an invoice from [facility]. Please contact [facility] at [phone] with questions or concerns regarding your invoice.   Our billing staff will not be able to assist you with questions regarding bills from these companies.  You will be contacted with the lab results as soon as they are available. The fastest way to get your results is to activate your My Chart account. Instructions are located on the last page of this paperwork. If you have not heard from us regarding the results in 2 weeks, please contact this office.      

## 2017-03-21 NOTE — Progress Notes (Signed)
Patient ID: Kimberly Glass, female    DOB: 1985/11/17, 31 y.o.   MRN: [id number]  PCP: Porfirio OarJeffery, Sawyer Kahan, PA-C  Chief Complaint  Patient presents with  . Head Congestion    Pt states congestion isn't as bad as it was but states she still doesn't feel good. Pt states she is experencing fatigue for most of the day.  . Ear Pain    Right ear, pt states that her ear is still painful. Pt states she was on a weeks worth of antibotics.  . Follow-up    Subjective:   Presents for evaluation of persistent RIGHT ear pain and congestion, following treatment for URI then AOM earlier this month.  She completed a course of cefdinir, and did improve some, but developed a vaginal yeast infection, now resolved. She notes that both of her parents and sister-in-law have had similar symptoms, following a beach trip where they were all together. Her father is improving, her mother has had 2 rounds of antibiotics and is now on oral steroids, and her sister-in-law is still not well.  Awakens in the morning feeling pretty good, but by midday, she is experiencing hot flashes and cold sweats and RIGHT ear pain recurs. She checks her temperature and is afebrile. Fatigue is present all day, but much worse in the afternoons.  No facial pain, headache, GI/GU symptoms. Not achy.  Notes that her work is more stressful. Several employees have been let go. She is looking for alternative positions.  Review of Systems As above.    Patient Active Problem List   Diagnosis Date Noted  . Bilateral hip pain 03/12/2017  . Hyperlipidemia 12/31/2016  . Pilonidal disease 01/11/2014  . Anxiety state, unspecified 06/22/2013  . BMI 45.0-49.9, adult (HCC) 06/22/2013  . Migraine 05/10/2013  . Endometriosis   . Irregular menses 07/30/2012  . Dysmenorrhea 07/30/2012  . Female pelvic pain 07/30/2012  . Vulvar lesion 07/30/2012  . Essential hypertension, benign      Prior to Admission medications   Medication Sig  Start Date End Date Taking? Authorizing Provider  ALPRAZolam (XANAX) 0.5 MG tablet TAKE 1/2 TO 1 TABLET BY MOUTH THREE TIMES DAILY AS NEEDED FOR ANXIETY 06/28/16  Yes Oseias Horsey, PA-C  Ibuprofen-Famotidine (DUEXIS) 800-26.6 MG TABS Take 1 tablet by mouth 2 (two) times daily. 01/09/17  Yes Cammy Copaean, Gregory Scott, MD  levonorgestrel (MIRENA) 20 MCG/24HR IUD 1 each by Intrauterine route once.   Yes [provider]  lisinopril (PRINIVIL,ZESTRIL) 5 MG tablet Take 1 tablet (5 mg total) by mouth daily. 06/28/16  Yes Irie Dowson, PA-C  Multiple Vitamins-Minerals (MULTIVITAMIN PO) Take by mouth.   Yes [provider]  venlafaxine XR (EFFEXOR-XR) 75 MG 24 hr capsule Take 1 capsule (75 mg total) by mouth daily with breakfast. 06/28/16  Yes Courtnay Petrilla, PA-C  benzonatate (TESSALON) 100 MG capsule Take 1-2 capsules (100-200 mg total) by mouth 3 (three) times daily as needed. Patient not taking: Reported on 03/21/2017 03/03/17   Peyton NajjarHopper, David H, MD  HYDROcodone-homatropine [facility](HYCODAN) 5-1.5 MG/5ML syrup Take 5 mLs by mouth every 8 (eight) hours as needed for cough. Patient not taking: Reported on 03/21/2017 03/07/17   McVey, Madelaine BhatElizabeth Whitney, PA-C     Allergies  Allergen Reactions  . Diphenhydramine Other (See Comments)    Hyperactivity  . Topiramate Diarrhea  . Yasmin [Drospirenone-Ethinyl Estradiol] Other (See Comments)    Migraine Headache  . Penicillins Hives       Objective:  Physical Exam  Constitutional: She is oriented to person, place, and time. She appears well-developed and well-nourished. She is active and cooperative. No distress.  BP 120/87 (BP Location: Right Arm, Patient Position: Sitting, Cuff Size: Large)   Pulse 88   Temp 98.1 F (36.7 C) (Oral)   Resp 18   Ht 5' 9.5" (1.765 m)   Wt (!) 304 lb 3.2 oz (138 kg)   LMP 03/17/2017   SpO2 98%   BMI 44.28 kg/m   HENT:  Head: Normocephalic and atraumatic.  Right Ear: Hearing, tympanic membrane, external ear  and ear canal normal.  Left Ear: Hearing, tympanic membrane, external ear and ear canal normal.  Nose: Mucosal edema present. No rhinorrhea, nose lacerations or sinus tenderness. No epistaxis.  No foreign bodies. Right sinus exhibits no maxillary sinus tenderness and no frontal sinus tenderness. Left sinus exhibits no maxillary sinus tenderness and no frontal sinus tenderness.  Mouth/Throat: Uvula is midline, oropharynx is clear and moist and mucous membranes are normal. No oral lesions. Normal dentition.  Eyes: Conjunctivae are normal. No scleral icterus.  Neck: Normal range of motion, full passive range of motion without pain and phonation normal. Neck supple. No thyromegaly present.  Cardiovascular: Normal rate, regular rhythm and normal heart sounds.   Pulses:      Radial pulses are 2+ on the right side, and 2+ on the left side.  Pulmonary/Chest: Effort normal and breath sounds normal.  Lymphadenopathy:       Head (right side): No tonsillar, no preauricular, no posterior auricular and no occipital adenopathy present.       Head (left side): No tonsillar, no preauricular, no posterior auricular and no occipital adenopathy present.    She has no cervical adenopathy.       Right: No supraclavicular adenopathy present.       Left: No supraclavicular adenopathy present.  Neurological: She is alert and oriented to person, place, and time. No sensory deficit.  Skin: Skin is warm, dry and intact. No rash noted. No cyanosis or erythema. Nails show no clubbing.  Psychiatric: She has a normal mood and affect. Her speech is normal and behavior is normal.        Assessment & Plan:   1. Right ear pain 2. Acute upper respiratory infection Resolving symptoms from URI/possible sinusitis and AOM, but with persistent inflammation. Supportive care.  Anticipatory guidance.  RTC if symptoms worsen/persist. - predniSONE (DELTASONE) 20 MG tablet; Take 3 PO QAM x3days, 2 PO QAM x3days, 1 PO QAM x3days   Dispense: 18 tablet; Refill: 0 - azelastine (ASTELIN) 0.1 % nasal spray; Place 2 sprays into both nostrils 2 (two) times daily. Use in each nostril as directed  Dispense: 30 mL; Refill: 0  3. Anxiety Aggravated by increased work stress. Continue pursuit of alternate employment. - ALPRAZolam (XANAX) 0.5 MG tablet; TAKE 1/2 TO 1 TABLET BY MOUTH THREE TIMES DAILY AS NEEDED FOR ANXIETY  Dispense: 30 tablet; Refill: 0    Return if symptoms worsen or fail to improve.   Fernande Bras, PA-C Primary Care at [facility]

## 2017-06-10 ENCOUNTER — Other Ambulatory Visit: Payer: Self-pay | Admitting: Physician Assistant

## 2017-06-10 DIAGNOSIS — I10 Essential (primary) hypertension: Secondary | ICD-10-CM

## 2017-07-04 ENCOUNTER — Ambulatory Visit: Payer: BLUE CROSS/BLUE SHIELD | Admitting: Physician Assistant

## 2017-07-19 ENCOUNTER — Ambulatory Visit: Payer: BLUE CROSS/BLUE SHIELD | Admitting: Physician Assistant

## 2017-08-09 ENCOUNTER — Ambulatory Visit: Payer: 59 | Admitting: Physician Assistant

## 2017-08-09 ENCOUNTER — Encounter: Payer: Self-pay | Admitting: Physician Assistant

## 2017-08-09 ENCOUNTER — Other Ambulatory Visit: Payer: Self-pay

## 2017-08-09 VITALS — BP 118/70 | HR 94 | Temp 98.9°F | Resp 18 | Ht 69.5 in | Wt 306.2 lb

## 2017-08-09 DIAGNOSIS — Z23 Encounter for immunization: Secondary | ICD-10-CM | POA: Diagnosis not present

## 2017-08-09 DIAGNOSIS — I10 Essential (primary) hypertension: Secondary | ICD-10-CM | POA: Diagnosis not present

## 2017-08-09 DIAGNOSIS — F419 Anxiety disorder, unspecified: Secondary | ICD-10-CM | POA: Diagnosis not present

## 2017-08-09 MED ORDER — TYPHOID VACCINE PO CPDR
1.0000 | DELAYED_RELEASE_CAPSULE | ORAL | 0 refills | Status: DC
Start: 2017-08-09 — End: 2017-11-08

## 2017-08-09 MED ORDER — LISINOPRIL 5 MG PO TABS: ORAL_TABLET | ORAL | 3 refills | Status: DC

## 2017-08-09 MED ORDER — VENLAFAXINE HCL ER 75 MG PO CP24: 75.0000 mg | ORAL_CAPSULE | Freq: Every day | ORAL | 3 refills | Status: DC

## 2017-08-09 NOTE — Patient Instructions (Signed)
     IF you received an x-ray today, you will receive an invoice from [facility]. Please contact [facility] at [phone] with questions or concerns regarding your invoice.   IF you received labwork today, you will receive an invoice from [facility]. Please contact [facility] at [phone] with questions or concerns regarding your invoice.   Our billing staff will not be able to assist you with questions regarding bills from these companies.  You will be contacted with the lab results as soon as they are available. The fastest way to get your results is to activate your My Chart account. Instructions are located on the last page of this paperwork. If you have not heard from us regarding the results in 2 weeks, please contact this office.     

## 2017-08-10 NOTE — Assessment & Plan Note (Signed)
Well controlled. COntinue venlafaxine 75 mg.

## 2017-08-10 NOTE — Assessment & Plan Note (Signed)
Well controlled. Continue lisinopril  5 mg. Will change back to methyldopa when she is ready to conceive.

## 2017-08-10 NOTE — Progress Notes (Signed)
Patient ID: Kimberly Glass, female    DOB: Oct 13, 1985, 32 y.o.   MRN: [id number]  PCP: Porfirio Oar, PA-C  Chief Complaint  Patient presents with  . Medication Refill    lisinopril and venlafaxine and wants to dicuss any needed shots for traveling to [place]     Subjective:   Presents for evaluation of HTN, anxiety and travel vaccinations.  She is doing well on lisinopril for HTN management. No adverse effects. Is aware of the need to STOP this drug prior to trying to conceive, which is not planned for at least 6-12 months from now.  Anxiety is well controlled with current dose of venlafaxine.  She and her husband will be traveling to [place] in March, and she needs vaccines. [facility] review reveals that she is current on her routine vaccinations. Hepatitis A is recommended, and typhoid prevention. Delanna Ahmadi is a risk in [place], and recommendations are to delay attempts to conceive for at least 3 months following return.    Review of Systems No chest pain, SOB, HA, dizziness, vision change, N/V, diarrhea, constipation, dysuria, urinary urgency or frequency, myalgias, arthralgias or rash.   Patient Active Problem List   Diagnosis Date Noted  . Bilateral hip pain 03/12/2017  . Hyperlipidemia 12/31/2016  . Pilonidal disease 01/11/2014  . Anxiety state, unspecified 06/22/2013  . BMI 40.0-44.9, adult (HCC) 06/22/2013  . Migraine 05/10/2013  . Endometriosis   . Irregular menses 07/30/2012  . Dysmenorrhea 07/30/2012  . Female pelvic pain 07/30/2012  . Vulvar lesion 07/30/2012  . Essential hypertension, benign      Prior to Admission medications   Medication Sig Start Date End Date Taking? Authorizing Provider  ALPRAZolam (XANAX) 0.5 MG tablet TAKE 1/2 TO 1 TABLET BY MOUTH THREE TIMES DAILY AS NEEDED FOR ANXIETY 03/21/17  Yes Kaycee Haycraft, PA-C  azelastine (ASTELIN) 0.1 % nasal spray Place 2 sprays into both nostrils 2 (two) times daily. Use in each nostril as directed 03/21/17  Yes  Dorothymae Maciver, PA-C  Ibuprofen-Famotidine (DUEXIS) 800-26.6 MG TABS Take 1 tablet by mouth 2 (two) times daily. 01/09/17  Yes Cammy Copa, MD  levonorgestrel (MIRENA) 20 MCG/24HR IUD 1 each by Intrauterine route once.   Yes [provider]  lisinopril (PRINIVIL,ZESTRIL) 5 MG tablet TAKE 1 TABLET(5 MG) BY MOUTH DAILY 08/09/17  Yes Gurkaran Rahm, PA-C  Multiple Vitamins-Minerals (MULTIVITAMIN PO) Take by mouth.   Yes [provider]  venlafaxine XR (EFFEXOR-XR) 75 MG 24 hr capsule Take 1 capsule (75 mg total) by mouth daily with breakfast. 08/09/17  Yes Suhail Peloquin, PA-C  typhoid (VIVOTIF) DR capsule Take 1 capsule by mouth every other day. 08/09/17   Porfirio Oar, PA-C     Allergies  Allergen Reactions  . Diphenhydramine Other (See Comments)    Hyperactivity  . Topiramate Diarrhea  . Yasmin [Drospirenone-Ethinyl Estradiol] Other (See Comments)    Migraine Headache  . Penicillins Hives       Objective:  Physical Exam  Constitutional: She is oriented to person, place, and time. She appears well-developed and well-nourished. She is active and cooperative. No distress.  BP 118/70   Pulse 94   Temp 98.9 F (37.2 C) (Oral)   Resp 18   Ht 5' 9.5" (1.765 m)   Wt (!) 306 lb 3.2 oz (138.9 kg)   LMP 08/09/2017 Comment: currently on   SpO2 97%   BMI 44.57 kg/m   HENT:  Head: Normocephalic and atraumatic.  Right Ear: Hearing normal.  Left Ear: Hearing normal.  Eyes: Conjunctivae are normal. No scleral icterus.  Neck: Normal range of motion. Neck supple. No thyromegaly present.  Cardiovascular: Normal rate, regular rhythm and normal heart sounds.  Pulses:      Radial pulses are 2+ on the right side, and 2+ on the left side.  Pulmonary/Chest: Effort normal and breath sounds normal.  Lymphadenopathy:       Head (right side): No tonsillar, no preauricular, no posterior auricular and no occipital adenopathy present.       Head (left side): No tonsillar, no  preauricular, no posterior auricular and no occipital adenopathy present.    She has no cervical adenopathy.       Right: No supraclavicular adenopathy present.       Left: No supraclavicular adenopathy present.  Neurological: She is alert and oriented to person, place, and time. No sensory deficit.  Skin: Skin is warm, dry and intact. No rash noted. No cyanosis or erythema. Nails show no clubbing.  Psychiatric: She has a normal mood and affect. Her speech is normal and behavior is normal.    Wt Readings from Last 3 Encounters:  08/09/17 (!) 306 lb 3.2 oz (138.9 kg)  03/21/17 (!) 304 lb 3.2 oz (138 kg)  03/07/17 (!) 305 lb 6.4 oz (138.5 kg)          Assessment & Plan:   Problem List Items Addressed This Visit    Essential hypertension, benign - Primary    Well controlled. Continue lisinopril  5 mg. Will change back to methyldopa when she is ready to conceive.      Relevant Medications   lisinopril (PRINIVIL,ZESTRIL) 5 MG tablet   Anxiety    Well controlled. COntinue venlafaxine 75 mg.      Relevant Medications   venlafaxine XR (EFFEXOR-XR) 75 MG 24 hr capsule    Other Visit Diagnoses    Need for influenza vaccination       Relevant Orders   Flu Vaccine QUAD 36+ mos IM (Completed)   Need for hepatitis A vaccination       Relevant Orders   Hepatitis A vaccine adult IM (Completed)   Need for immunization against typhoid       Relevant Medications   typhoid (VIVOTIF) DR capsule       Return in about 6 months (around 02/06/2018) for Annual Exam, fasting labs and Hep A #2.   Fernande Brashelle S. Kimothy Kishimoto, PA-C Primary Care at [facility]

## 2017-10-23 ENCOUNTER — Encounter: Payer: Self-pay | Admitting: Physician Assistant

## 2017-11-02 ENCOUNTER — Telehealth: Payer: BLUE CROSS/BLUE SHIELD | Admitting: Family

## 2017-11-02 DIAGNOSIS — J219 Acute bronchiolitis, unspecified: Secondary | ICD-10-CM

## 2017-11-02 MED ORDER — PREDNISONE 10 MG (21) PO TBPK: ORAL_TABLET | ORAL | 0 refills | Status: DC

## 2017-11-02 NOTE — Progress Notes (Signed)

## 2017-11-08 ENCOUNTER — Ambulatory Visit: Payer: 59 | Admitting: Family Medicine

## 2017-11-08 ENCOUNTER — Encounter: Payer: Self-pay | Admitting: Family Medicine

## 2017-11-08 VITALS — BP 128/70 | HR 85 | Temp 97.4°F | Ht 70.0 in | Wt 306.8 lb

## 2017-11-08 DIAGNOSIS — R059 Cough, unspecified: Secondary | ICD-10-CM

## 2017-11-08 DIAGNOSIS — J22 Unspecified acute lower respiratory infection: Secondary | ICD-10-CM | POA: Diagnosis not present

## 2017-11-08 DIAGNOSIS — R05 Cough: Secondary | ICD-10-CM

## 2017-11-08 MED ORDER — AZITHROMYCIN 250 MG PO TABS: ORAL_TABLET | ORAL | 0 refills | Status: DC

## 2017-11-08 NOTE — Progress Notes (Signed)
Subjective:  By signing my name below, I, Stann Ore, attest that this documentation has been prepared under the direction and in the presence of Meredith Staggers, MD. Electronically Signed: Stann Ore, Scribe. 11/08/2017 , 9:20 AM .  Patient was seen in Room 13 .   Patient ID: Kimberly Glass, female    DOB: [birth year], 32 y.o.   MRN: [id number] Chief Complaint  Patient presents with  . Cough    Draiange from nose to chest, headaches, every morning throws up  and cant catch breath. X3 weeks   HPI Kimberly Glass is a 32 y.o. female  Patient states her illness started initially with drainage and constant cough with a headache about 3 weeks ago. She describes her cough less constant, but coughing up discolored mucus (greenish yellow) daily. She notes her coughs become coughing fits where it's hard to catch her breath, and causing her to vomit in the morning. She informs feeling it more in her chest than her sinuses. She initially tried delsym with decongestant and then switched to mucinex. She takes Zyrtec and Flonase for seasonal allergies. She denies recent fevers. She denies history of asthma or lung problems.   She mentions doing [facility] E-visit 6 days ago, started Sterapred 10mg  dosepak; no antibiotics prescribed at that time. She also notes having leftover tessalon pearls from previous visit. She isn't able to sleep laying down, so she's been sleeping sitting up, waking up about 3-4 times a night. She's taken prescription cough syrup in the past without relief.   Patient Active Problem List   Diagnosis Date Noted  . Bilateral hip pain 03/12/2017  . Hyperlipidemia 12/31/2016  . Pilonidal disease 01/11/2014  . Anxiety 06/22/2013  . BMI 40.0-44.9, adult (HCC) 06/22/2013  . Migraine 05/10/2013  . Endometriosis   . Irregular menses 07/30/2012  . Dysmenorrhea 07/30/2012  . Female pelvic pain 07/30/2012  . Vulvar lesion 07/30/2012  . Essential hypertension, benign    Past  Medical History:  Diagnosis Date  . Acne vulgaris   . Allergic rhinitis, cause unspecified   . Anxiety   . Endometriosis dx Jan 2014  . Erythema nodosum    associated with menses; improved with OCP  . Essential hypertension, benign   . H/O pilonidal cyst   . Headache, migraine   . Obesity, unspecified    Past Surgical History:  Procedure Laterality Date  . OPEN REDUCTION INTERNAL FIXATION (ORIF) METACARPAL Right 10/28/2014   Procedure: OPEN REDUCTION INTERNAL FIXATION RIGHT FINGER METACARPAL FRACTURE;  Surgeon: Dairl Ponder, MD;  Location: [facility] OR;  Service: Orthopedics;  Laterality: Right;  . ORIF RADIAL FRACTURE Right 10/28/2014   Procedure: OPEN REDUCTION INTERNAL FIXATION RIGHT RADIUS FRACTURE;  Surgeon: Dairl Ponder, MD;  Location: [facility] OR;  Service: Orthopedics;  Laterality: Right;  . ORIF SCAPHOID FRACTURE Right 10/28/2014   Procedure: OPEN REDUCTION INTERNAL FIXATION (ORIF) SCAPHOID FRACTURE;  Surgeon: Dairl Ponder, MD;  Location: [facility] OR;  Service: Orthopedics;  Laterality: Right;  . PILONIDAL CYST EXCISION  01/30/2015   Ardeth Sportsman, MD  . WISDOM TOOTH EXTRACTION     Allergies  Allergen Reactions  . Diphenhydramine Other (See Comments)    Hyperactivity  . Topiramate Diarrhea  . Yasmin [Drospirenone-Ethinyl Estradiol] Other (See Comments)    Migraine Headache  . Penicillins Hives   Prior to Admission medications   Medication Sig Start Date End Date Taking? Authorizing Provider  ALPRAZolam (XANAX) 0.5 MG tablet TAKE 1/2 TO 1 TABLET BY MOUTH THREE TIMES DAILY AS  NEEDED FOR ANXIETY 03/21/17   Porfirio Oar, PA-C  azelastine (ASTELIN) 0.1 % nasal spray Place 2 sprays into both nostrils 2 (two) times daily. Use in each nostril as directed 03/21/17   Porfirio Oar, PA-C  Ibuprofen-Famotidine (DUEXIS) 800-26.6 MG TABS Take 1 tablet by mouth 2 (two) times daily. 01/09/17   Cammy Copa, MD  levonorgestrel (MIRENA) 20 MCG/24HR IUD 1 each by Intrauterine route once.     [provider]  lisinopril (PRINIVIL,ZESTRIL) 5 MG tablet TAKE 1 TABLET(5 MG) BY MOUTH DAILY 08/09/17   Porfirio Oar, PA-C  Multiple Vitamins-Minerals (MULTIVITAMIN PO) Take by mouth.    [provider]  predniSONE (STERAPRED UNI-PAK 21 TAB) 10 MG (21) TBPK tablet Use as directed 11/02/17   Jannifer Rodney A, FNP  typhoid (VIVOTIF) DR capsule Take 1 capsule by mouth every other day. 08/09/17   Porfirio Oar, PA-C  venlafaxine XR (EFFEXOR-XR) 75 MG 24 hr capsule Take 1 capsule (75 mg total) by mouth daily with breakfast. 08/09/17   Porfirio Oar, PA-C   Social History   Socioeconomic History  . Marital status: Married    Spouse name: Sandrika Schwinn  . Number of children: 0  . Years of education: college  . Highest education level: Not on file  Occupational History  . Occupation: Engineer, civil (consulting): [employer]    Comment: [employer]  Social Needs  . Financial resource strain: Not on file  . Food insecurity:    Worry: Not on file    Inability: Not on file  . Transportation needs:    Medical: Not on file    Non-medical: Not on file  Tobacco Use  . Smoking status: Never Smoker  . Smokeless tobacco: Never Used  Substance and Sexual Activity  . Alcohol use: Yes    Alcohol/week: 0.6 oz    Types: 1 Cans of beer per week    Comment: rarely   . Drug use: No  . Sexual activity: Yes    Partners: Male    Birth control/protection: IUD  Lifestyle  . Physical activity:    Days per week: Not on file    Minutes per session: Not on file  . Stress: Not on file  Relationships  . Social connections:    Talks on phone: Not on file    Gets together: Not on file    Attends religious service: Not on file    Active member of club or organization: Not on file    Attends meetings of clubs or organizations: Not on file    Relationship status: Not on file  . Intimate partner violence:    Fear of current or ex partner: Not on file    Emotionally abused:  Not on file    Physically abused: Not on file    Forced sexual activity: Not on file  Other Topics Concern  . Not on file  Social History Narrative   Lives with her husband (married 05/04/2016) in the house she purchased 03/27/2014.   Her parents live nearby.   Her brother, sister-in-law and nephew live nearby.   Review of Systems  Constitutional: Negative for chills, fatigue, fever and unexpected weight change.  HENT: Positive for congestion. Negative for sinus pressure, sinus pain and sore throat.   Respiratory: Positive for cough and shortness of breath. Negative for chest tightness and wheezing.   Gastrointestinal: Positive for vomiting. Negative for constipation, diarrhea and nausea.  Skin: Negative for rash and wound.  Neurological:  Positive for headaches. Negative for dizziness and weakness.       Objective:   Physical Exam  Constitutional: She is oriented to person, place, and time. She appears well-developed and well-nourished. No distress.  HENT:  Head: Atraumatic.  Right Ear: Hearing, tympanic membrane, external ear and ear canal normal.  Left Ear: Hearing, tympanic membrane, external ear and ear canal normal.  Nose: Nose normal.  Mouth/Throat: Oropharynx is clear and moist. No oropharyngeal exudate.  Eyes: Pupils are equal, round, and reactive to light. Conjunctivae and EOM are normal.  Cardiovascular: Normal rate, regular rhythm, normal heart sounds and intact distal pulses.  No murmur heard. Pulmonary/Chest: Effort normal and breath sounds normal. No respiratory distress. She has no wheezes. She has no rhonchi.  Neurological: She is alert and oriented to person, place, and time.  Skin: Skin is warm and dry. No rash noted.  Psychiatric: She has a normal mood and affect. Her behavior is normal.  Vitals reviewed.   Vitals:   11/08/17 0904  BP: 128/70  Pulse: 85  Temp: (!) 97.4 F (36.3 C)  TempSrc: Oral  SpO2: 98%  Weight: (!) 306 lb 12.8 oz (139.2 kg)    Height: 5\' 10"  (1.778 m)       Assessment & Plan:    Kimberly Glass is a 32 y.o. female LRTI (lower respiratory tract infection) - Plan: azithromycin (ZITHROMAX) 250 MG tablet  Cough - Plan: azithromycin (ZITHROMAX) 250 MG tablet  Persistent cough with coughing fits, including posttussive emesis.  Concerning for atypical infection, lower respiratory tract infection.  Vital signs reassuring, lungs overall clear.  Held on chest x-ray today.  -Continue Zyrtec, Flonase for possible allergic cause with postnasal drip, continue symptomatic care with Mucinex or Mucinex DM for cough, Tessalon if needed, teaspoon of honey at bedtime if needed.   - start azithromycin, Z-Pak, RTC precautions if persistent or worsening symptoms.    Meds ordered this encounter  Medications  . azithromycin (ZITHROMAX) 250 MG tablet    Sig: Take 2 pills by mouth on day 1, then 1 pill by mouth per day on days 2 through 5.    Dispense:  6 tablet    Refill:  0   Patient Instructions    Continue Mucinex or Mucinex DM for cough, teaspoon of honey at night as we discussed may be helpful.  If you drinking plenty fluids, continue Flonase and Zyrtec for allergies.  Start azithromycin for cough and possible lower respiratory infection/bronchitis.  See information below on bronchitis.  If symptoms are not improving in the next week to 10 days, or worsening sooner, please return for recheck.   Cough, Adult Coughing is a reflex that clears your throat and your airways. Coughing helps to heal and protect your lungs. It is normal to cough occasionally, but a cough that happens with other symptoms or lasts a long time may be a sign of a condition that needs treatment. A cough may last only 2-3 weeks (acute), or it may last longer than 8 weeks (chronic). What are the causes? Coughing is commonly caused by:  Breathing in substances that irritate your lungs.  A viral or bacterial respiratory  infection.  Allergies.  Asthma.  Postnasal drip.  Smoking.  Acid backing up from the stomach into the esophagus (gastroesophageal reflux).  Certain medicines.  Chronic lung problems, including COPD (or rarely, lung cancer).  Other medical conditions such as heart failure.  Follow these instructions at home: Pay attention to any changes in  your symptoms. Take these actions to help with your discomfort:  Take medicines only as told by your health care provider. ? If you were prescribed an antibiotic medicine, take it as told by your health care provider. Do not stop taking the antibiotic even if you start to feel better. ? Talk with your health care provider before you take a cough suppressant medicine.  Drink enough fluid to keep your urine clear or pale yellow.  If the air is dry, use a cold steam vaporizer or humidifier in your bedroom or your home to help loosen secretions.  Avoid anything that causes you to cough at work or at home.  If your cough is worse at night, try sleeping in a semi-upright position.  Avoid cigarette smoke. If you smoke, quit smoking. If you need help quitting, ask your health care provider.  Avoid caffeine.  Avoid alcohol.  Rest as needed.  Contact a health care provider if:  You have new symptoms.  You cough up pus.  Your cough does not get better after 2-3 weeks, or your cough gets worse.  You cannot control your cough with suppressant medicines and you are losing sleep.  You develop pain that is getting worse or pain that is not controlled with pain medicines.  You have a fever.  You have unexplained weight loss.  You have night sweats. Get help right away if:  You cough up blood.  You have difficulty breathing.  Your heartbeat is very fast. This information is not intended to replace advice given to you by your health care provider. Make sure you discuss any questions you have with your health care provider. Document  Released: 01/04/2011 Document Revised: 12/14/2015 Document Reviewed: 09/14/2014 Elsevier Interactive Patient Education  2018 [employer]   IF you received an x-ray today, you will receive an invoice from [facility]. Please contact [facility] at [phone] with questions or concerns regarding your invoice.   IF you received labwork today, you will receive an invoice from [facility]. Please contact [facility] at [phone] with questions or concerns regarding your invoice.   Our billing staff will not be able to assist you with questions regarding bills from these companies.  You will be contacted with the lab results as soon as they are available. The fastest way to get your results is to activate your My Chart account. Instructions are located on the last page of this paperwork. If you have not heard from us regarding the results in 2 weeks, please contact this office.       I personally performed the services described in this documentation, which was scribed in my presence. The recorded information has been reviewed and considered for accuracy and completeness, addended by me as needed, and agree with information above.  Signed,   Meredith StaggersJeffrey Caylen Yardley, MD Primary Care at [facility].  11/08/17 9:27 AM

## 2017-11-08 NOTE — Patient Instructions (Addendum)
Continue Mucinex or Mucinex DM for cough, teaspoon of honey at night as we discussed may be helpful.  If you drinking plenty fluids, continue Flonase and Zyrtec for allergies.  Start azithromycin for cough and possible lower respiratory infection/bronchitis.  See information below on bronchitis.  If symptoms are not improving in the next week to 10 days, or worsening sooner, please return for recheck.   Cough, Adult Coughing is a reflex that clears your throat and your airways. Coughing helps to heal and protect your lungs. It is normal to cough occasionally, but a cough that happens with other symptoms or lasts a long time may be a sign of a condition that needs treatment. A cough may last only 2-3 weeks (acute), or it may last longer than 8 weeks (chronic). What are the causes? Coughing is commonly caused by:  Breathing in substances that irritate your lungs.  A viral or bacterial respiratory infection.  Allergies.  Asthma.  Postnasal drip.  Smoking.  Acid backing up from the stomach into the esophagus (gastroesophageal reflux).  Certain medicines.  Chronic lung problems, including COPD (or rarely, lung cancer).  Other medical conditions such as heart failure.  Follow these instructions at home: Pay attention to any changes in your symptoms. Take these actions to help with your discomfort:  Take medicines only as told by your health care provider. ? If you were prescribed an antibiotic medicine, take it as told by your health care provider. Do not stop taking the antibiotic even if you start to feel better. ? Talk with your health care provider before you take a cough suppressant medicine.  Drink enough fluid to keep your urine clear or pale yellow.  If the air is dry, use a cold steam vaporizer or humidifier in your bedroom or your home to help loosen secretions.  Avoid anything that causes you to cough at work or at home.  If your cough is worse at night, try sleeping  in a semi-upright position.  Avoid cigarette smoke. If you smoke, quit smoking. If you need help quitting, ask your health care provider.  Avoid caffeine.  Avoid alcohol.  Rest as needed.  Contact a health care provider if:  You have new symptoms.  You cough up pus.  Your cough does not get better after 2-3 weeks, or your cough gets worse.  You cannot control your cough with suppressant medicines and you are losing sleep.  You develop pain that is getting worse or pain that is not controlled with pain medicines.  You have a fever.  You have unexplained weight loss.  You have night sweats. Get help right away if:  You cough up blood.  You have difficulty breathing.  Your heartbeat is very fast. This information is not intended to replace advice given to you by your health care provider. Make sure you discuss any questions you have with your health care provider. Document Released: 01/04/2011 Document Revised: 12/14/2015 Document Reviewed: 09/14/2014 Elsevier Interactive Patient Education  2018 [employer]   IF you received an x-ray today, you will receive an invoice from [facility]. Please contact [facility] at [phone] with questions or concerns regarding your invoice.   IF you received labwork today, you will receive an invoice from [facility]. Please contact [facility] at [phone] with questions or concerns regarding your invoice.   Our billing staff will not be able to assist you with questions regarding bills from these companies.  You will be contacted with the lab results as  soon as they are available. The fastest way to get your results is to activate your My Chart account. Instructions are located on the last page of this paperwork. If you have not heard from [place] regarding the results in 2 weeks, please contact this office.

## 2017-12-04 ENCOUNTER — Ambulatory Visit (INDEPENDENT_AMBULATORY_CARE_PROVIDER_SITE_OTHER): Payer: 59

## 2017-12-04 ENCOUNTER — Other Ambulatory Visit: Payer: Self-pay

## 2017-12-04 ENCOUNTER — Encounter: Payer: Self-pay | Admitting: Physician Assistant

## 2017-12-04 ENCOUNTER — Ambulatory Visit: Payer: 59 | Admitting: Physician Assistant

## 2017-12-04 VITALS — BP 112/84 | HR 88 | Temp 98.7°F | Resp 16 | Ht 70.0 in | Wt 305.0 lb

## 2017-12-04 DIAGNOSIS — R0981 Nasal congestion: Secondary | ICD-10-CM

## 2017-12-04 DIAGNOSIS — R059 Cough, unspecified: Secondary | ICD-10-CM

## 2017-12-04 DIAGNOSIS — R05 Cough: Secondary | ICD-10-CM

## 2017-12-04 DIAGNOSIS — J069 Acute upper respiratory infection, unspecified: Secondary | ICD-10-CM

## 2017-12-04 MED ORDER — FLUCONAZOLE 150 MG PO TABS: 150.0000 mg | ORAL_TABLET | Freq: Once | ORAL | 0 refills | Status: AC

## 2017-12-04 MED ORDER — HYDROCOD POLST-CPM POLST ER 10-8 MG/5ML PO SUER: 5.0000 mL | Freq: Two times a day (BID) | ORAL | 0 refills | Status: DC | PRN

## 2017-12-04 MED ORDER — PREDNISONE 20 MG PO TABS: ORAL_TABLET | ORAL | 0 refills | Status: DC

## 2017-12-04 MED ORDER — CEFDINIR 300 MG PO CAPS: 600.0000 mg | ORAL_CAPSULE | Freq: Every day | ORAL | 0 refills | Status: DC

## 2017-12-04 MED ORDER — AZELASTINE HCL 0.1 % NA SOLN: 2.0000 | Freq: Two times a day (BID) | NASAL | 0 refills | Status: DC

## 2017-12-04 NOTE — Progress Notes (Signed)
Patient ID: Kimberly Glass, female    DOB: 1986-03-14, 32 y.o.   MRN: [id number]  PCP: Porfirio Oar, PA-C  Cough since April 1st and pain in right rib  Subjective:   Presents for evaluation of cough x 6 weeks.  Cough began 10/20/2017. Started OTC oral antihistamine and steroid nasal spray 1 week later without improvement in nasal congestion. She initiated an e-visit on 11/02/2017 and was diagnosed with bronchitis. She reported associated headache, no fever. She was prescribed a steroid dose pack.  Presented here to one of my colleagues on 11/08/2017 with persistent symptoms. SOB associated with coughing episodes, leading to emesis. Addition of Delsym and Mucinex without benefit. Coughing, worse at night and with lying down, was keeping her awake at night. She was prescribed azithromycin, without improvement.  She has previously required steroids to resolve persistent cough, and notes that prescription cough syrup has previously been ineffective.  Coughs to emesis. Yesterday developed pain in the RIGHT chest wall, upper ribs. Pulls when she isn't wearing a bra. Yellow sputum. Nasal/sinus congestion. Nasal drainage is clear. No fever, chills.  Review of Systems As above.    Patient Active Problem List   Diagnosis Date Noted  . Bilateral hip pain 03/12/2017  . Hyperlipidemia 12/31/2016  . Pilonidal disease 01/11/2014  . Anxiety 06/22/2013  . BMI 40.0-44.9, adult (HCC) 06/22/2013  . Migraine 05/10/2013  . Endometriosis   . Irregular menses 07/30/2012  . Dysmenorrhea 07/30/2012  . Female pelvic pain 07/30/2012  . Vulvar lesion 07/30/2012  . Essential hypertension, benign      Prior to Admission medications   Medication Sig Start Date End Date Taking? Authorizing Provider  ALPRAZolam (XANAX) 0.5 MG tablet TAKE 1/2 TO 1 TABLET BY MOUTH THREE TIMES DAILY AS NEEDED FOR ANXIETY 03/21/17  Yes Verle Brillhart, PA-C  azelastine (ASTELIN) 0.1 % nasal spray Place 2 sprays  into both nostrils 2 (two) times daily. Use in each nostril as directed 03/21/17  Yes Miria Cappelli, PA-C  Ibuprofen-Famotidine (DUEXIS) 800-26.6 MG TABS Take 1 tablet by mouth 2 (two) times daily. 01/09/17  Yes Cammy Copa, MD  levonorgestrel (MIRENA) 20 MCG/24HR IUD 1 each by Intrauterine route once.   Yes [provider]  lisinopril (PRINIVIL,ZESTRIL) 5 MG tablet TAKE 1 TABLET(5 MG) BY MOUTH DAILY 08/09/17  Yes Dejai Schubach, PA-C  Multiple Vitamins-Minerals (MULTIVITAMIN PO) Take by mouth.   Yes [provider]  venlafaxine XR (EFFEXOR-XR) 75 MG 24 hr capsule Take 1 capsule (75 mg total) by mouth daily with breakfast. 08/09/17  Yes Leotis Shames, Maribelle Hopple, PA-C     Allergies  Allergen Reactions  . Diphenhydramine Other (See Comments)    Hyperactivity  . Topiramate Diarrhea  . Yasmin [Drospirenone-Ethinyl Estradiol] Other (See Comments)    Migraine Headache  . Penicillins Hives       Objective:  Physical Exam  Constitutional: She is oriented to person, place, and time. She appears well-developed and well-nourished. No distress.  BP 112/84   Pulse 88   Temp 98.7 F (37.1 C)   Resp 16   Ht  (1.778 m)   Wt (!) 305 lb (138.3 kg)   LMP 11/05/2017   SpO2 96%   BMI 43.76 kg/m    HENT:  Head: Normocephalic and atraumatic.  Right Ear: Hearing, tympanic membrane, external ear and ear canal normal.  Left Ear: Hearing, tympanic membrane, external ear and ear canal normal.  Nose: Mucosal edema and rhinorrhea present.  No foreign bodies. Right  sinus exhibits no maxillary sinus tenderness and no frontal sinus tenderness. Left sinus exhibits no maxillary sinus tenderness and no frontal sinus tenderness.  Mouth/Throat: Uvula is midline, oropharynx is clear and moist and mucous membranes are normal. No uvula swelling. No oropharyngeal exudate.  Eyes: Pupils are equal, round, and reactive to light. Conjunctivae and EOM are normal. Right eye exhibits no discharge. Left  eye exhibits no discharge. No scleral icterus.  Neck: Trachea normal, normal range of motion and full passive range of motion without pain. Neck supple. No thyroid mass and no thyromegaly present.  Cardiovascular: Normal rate, regular rhythm and normal heart sounds.  Pulmonary/Chest: Effort normal and breath sounds normal. She exhibits tenderness (RIGHT lateral ribs, superior).  Lymphadenopathy:       Head (right side): No submandibular, no tonsillar, no preauricular, no posterior auricular and no occipital adenopathy present.       Head (left side): No submandibular, no tonsillar, no preauricular and no occipital adenopathy present.    She has no cervical adenopathy.       Right: No supraclavicular adenopathy present.       Left: No supraclavicular adenopathy present.  Neurological: She is alert and oriented to person, place, and time. She has normal strength. No cranial nerve deficit or sensory deficit.  Skin: Skin is warm, dry and intact. No rash noted.  Psychiatric: She has a normal mood and affect. Her speech is normal and behavior is normal.   Wt Readings from Last 3 Encounters:  12/04/17 (!) 305 lb (138.3 kg)  11/08/17 (!) 306 lb 12.8 oz (139.2 kg)  08/09/17 (!) 306 lb 3.2 oz (138.9 kg)   Dg Chest 2 View  Result Date: 12/04/2017 CLINICAL DATA:  Cough, right rib pain. EXAM: CHEST - 2 VIEW COMPARISON:  None. FINDINGS: The heart size and mediastinal contours are within normal limits. Both lungs are clear. No pneumothorax or pleural effusion is noted. The visualized skeletal structures are unremarkable. IMPRESSION: No active cardiopulmonary disease. Electronically Signed   By: Lupita Raider, M.D.   On: 12/04/2017 09:37       Assessment & Plan:   1. Cough 2. Acute upper respiratory infection 3. Nasal congestion Reassuring CXR. Suspect that sinus symptoms are driving persistent symptoms. Possible subacute sinusitis. Treat as such, and repeat oral steroid taper (longer, higher dose).  Diflucan if needed following antibiotic. - DG Chest 2 View; Future - cefdinir (OMNICEF) 300 MG capsule; Take 2 capsules (600 mg total) by mouth daily.  Dispense: 20 capsule; Refill: 0 - predniSONE (DELTASONE) 20 MG tablet; Take 3 PO QAM x3days, 2 PO QAM x3days, 1 PO QAM x3days  Dispense: 18 tablet; Refill: 0 - chlorpheniramine-HYDROcodone (TUSSIONEX PENNKINETIC ER) 10-8 MG/5ML SUER; Take 5 mLs by mouth every 12 (twelve) hours as needed for cough.  Dispense: 60 mL; Refill: 0 - azelastine (ASTELIN) 0.1 % nasal spray; Place 2 sprays into both nostrils 2 (two) times daily. Use in each nostril as directed  Dispense: 30 mL; Refill: 0    Return if symptoms worsen or fail to improve.   Fernande Bras, PA-C Primary Care at [facility]

## 2017-12-04 NOTE — Patient Instructions (Addendum)
The radiographs are very reassuring. CONTINUE the Nasacort and the Zyrtec. ADD the azelastine. ADD the cefdinir (antibiotic), prednisone and hydrocodone-containing cough medication. Use the Diflucan if needed for vaginal yeast infection.    IF you received an x-ray today, you will receive an invoice from [facility]. Please contact [facility] at [phone] with questions or concerns regarding your invoice.   IF you received labwork today, you will receive an invoice from [facility]. Please contact [facility] at [phone] with questions or concerns regarding your invoice.   Our billing staff will not be able to assist you with questions regarding bills from these companies.  You will be contacted with the lab results as soon as they are available. The fastest way to get your results is to activate your My Chart account. Instructions are located on the last page of this paperwork. If you have not heard from [place] regarding the results in 2 weeks, please contact this office.

## 2018-01-21 ENCOUNTER — Encounter: Payer: Self-pay | Admitting: Urgent Care

## 2018-01-21 ENCOUNTER — Ambulatory Visit: Payer: 59 | Admitting: Urgent Care

## 2018-01-21 VITALS — BP 123/83 | HR 127 | Temp 99.0°F | Resp 17 | Ht 70.0 in | Wt 311.0 lb

## 2018-01-21 DIAGNOSIS — L089 Local infection of the skin and subcutaneous tissue, unspecified: Secondary | ICD-10-CM

## 2018-01-21 DIAGNOSIS — M546 Pain in thoracic spine: Secondary | ICD-10-CM

## 2018-01-21 DIAGNOSIS — L723 Sebaceous cyst: Secondary | ICD-10-CM

## 2018-01-21 MED ORDER — SULFAMETHOXAZOLE-TRIMETHOPRIM 800-160 MG PO TABS: 1.0000 | ORAL_TABLET | Freq: Two times a day (BID) | ORAL | 0 refills | Status: DC

## 2018-01-21 NOTE — Progress Notes (Signed)
    MRN: [id number] DOB: [birth year]  Subjective:   Kimberly Glass is a 32 y.o. female presenting for 1 to 2-week history of persistent left-sided superficial back pain.  Patient states that there are 2 areas that appear to be cysts and one has been draining but she cannot get the other one to drain.  The area is painful and a little swollen.  Denies fever, nausea, vomiting, belly pain.  Patient does have a dermatologist that she has not contacted in [place], [place].  Kimberly Glass has a current medication list which includes the following prescription(s): alprazolam, azelastine, cefdinir, cetirizine, chlorpheniramine-hydrocodone, ibuprofen-famotidine, levonorgestrel, lisinopril, multiple vitamins-minerals, prednisone, and venlafaxine xr. Also is allergic to diphenhydramine; topiramate; yasmin [drospirenone-ethinyl estradiol]; and penicillins.  Kimberly Glass  has a past medical history of Acne vulgaris, Allergic rhinitis, cause unspecified, Anxiety, Endometriosis (dx Jan 2014), Erythema nodosum, Essential hypertension, benign, H/O pilonidal cyst, Headache, migraine, and Obesity, unspecified. Also  has a past surgical history that includes Wisdom tooth extraction; ORIF radial fracture (Right, 10/28/2014); Open reduction internal fixation (orif) metacarpal (Right, 10/28/2014); ORIF scaphoid fracture (Right, 10/28/2014); and Pilonidal cyst excision (01/30/2015).  Objective:   Vitals: BP 123/83   Pulse (!) 127   Temp 99 F (37.2 C) (Oral)   Resp 17   Ht 5\' 10"  (1.778 m)   Wt (!) 311 lb (141.1 kg)   SpO2 98%   BMI 44.62 kg/m   Physical Exam  Constitutional: She is oriented to person, place, and time. She appears well-developed and well-nourished.  HENT:  Mouth/Throat: Oropharynx is clear and moist.  Cardiovascular: Normal rate.  Pulmonary/Chest: Effort normal.  Neurological: She is alert and oriented to person, place, and time.  Skin: Skin is warm and dry.     Psychiatric: She has a normal mood and affect.    PROCEDURE NOTE: I&D of Abscess Verbal consent obtained. Local anesthesia with 3cc of 2% lidocaine with epinephrine. Site cleansed with Betadine. 2 separate incisions of 1cm over each side made using a 11 blade, discharge of sebaceous material, sac remnants, serosanguinous fluid medially. Lateral area did not drain. Wound cavity was explored, found to be ~2cm medially and <1cm laterally. Wounds packed with 1/4" plain packing. Cleansed and dressed.   Assessment and Plan :   Acute left-sided thoracic back pain  Infected sebaceous cyst of skin  Inflamed sebaceous cyst  Start Bactrim, wound care reviewed. Recheck in 2 days. Follow up with dermatologist.  Wallis BambergMario Kyriakos Babler, PA-C Primary Care at [facility] [phone] 01/21/2018  3:34 PM

## 2018-01-21 NOTE — Patient Instructions (Addendum)
After you shower each day, change the dressing overlying the packing making sure to leave the packing in place until you come back for follow-up or see your dermatologist.    IF you received an x-ray today, you will receive an invoice from [facility]. Please contact [facility] at [phone] with questions or concerns regarding your invoice.   IF you received labwork today, you will receive an invoice from [facility]. Please contact [facility] at [phone] with questions or concerns regarding your invoice.   Our billing staff will not be able to assist you with questions regarding bills from these companies.  You will be contacted with the lab results as soon as they are available. The fastest way to get your results is to activate your My Chart account. Instructions are located on the last page of this paperwork. If you have not heard from us regarding the results in 2 weeks, please contact this office.

## 2018-01-23 ENCOUNTER — Ambulatory Visit: Payer: 59 | Admitting: Family Medicine

## 2018-01-23 ENCOUNTER — Encounter: Payer: Self-pay | Admitting: Family Medicine

## 2018-01-23 VITALS — BP 134/82 | HR 93 | Temp 98.6°F | Ht 70.0 in | Wt 310.8 lb

## 2018-01-23 DIAGNOSIS — Z5189 Encounter for other specified aftercare: Secondary | ICD-10-CM

## 2018-01-23 NOTE — Progress Notes (Signed)
7/5/20193:19 PM  Kimberly Glass 05-29-1986, 32 y.o. female [id number]  Chief Complaint  Patient presents with  . left shoulder blade area    recheck     HPI:   Patient is a 32 y.o. female who presents today for wound check   I+D of 2 sebaceous cysts on left upper back 2 days ago by Urban Gibson, PA-C On bactrim Reports doing well, a bit sore but redness and drainage resolved   Fall Risk  01/23/2018 01/21/2018 12/04/2017 11/08/2017 08/09/2017  Falls in the past year? No No No No No     Depression screen [facility] 2/9 01/23/2018 01/21/2018 12/04/2017  Decreased Interest 0 0 0  Down, Depressed, Hopeless 0 0 0  PHQ - 2 Score 0 0 0    Allergies  Allergen Reactions  . Diphenhydramine Other (See Comments)    Hyperactivity  . Topiramate Diarrhea  . Yasmin [Drospirenone-Ethinyl Estradiol] Other (See Comments)    Migraine Headache  . Penicillins Hives    Prior to Admission medications   Medication Sig Start Date End Date Taking? Authorizing Provider  ALPRAZolam (XANAX) 0.5 MG tablet TAKE 1/2 TO 1 TABLET BY MOUTH THREE TIMES DAILY AS NEEDED FOR ANXIETY 03/21/17  Yes Jeffery, Chelle, PA-C  azelastine (ASTELIN) 0.1 % nasal spray Place 2 sprays into both nostrils 2 (two) times daily. Use in each nostril as directed 12/04/17  Yes Jeffery, Chelle, PA-C  cetirizine (ZYRTEC) 5 MG tablet TAKE 2 TABLETS PO DAILY FOR 14 DAYS. AVOID IN LACTATION 10/26/17  Yes [provider]  levonorgestrel (MIRENA) 20 MCG/24HR IUD 1 each by Intrauterine route once.   Yes [provider]  lisinopril (PRINIVIL,ZESTRIL) 5 MG tablet TAKE 1 TABLET(5 MG) BY MOUTH DAILY 08/09/17  Yes Jeffery, Chelle, PA-C  Multiple Vitamins-Minerals (MULTIVITAMIN PO) Take by mouth.   Yes [provider]  sulfamethoxazole-trimethoprim (BACTRIM DS,SEPTRA DS) 800-160 MG tablet Take 1 tablet by mouth 2 (two) times daily. 01/21/18  Yes Wallis Bamberg, PA-C  venlafaxine XR (EFFEXOR-XR) 75 MG 24 hr capsule Take 1 capsule (75 mg total) by  mouth daily with breakfast. 08/09/17  Yes Porfirio Oar, PA-C    Past Medical History:  Diagnosis Date  . Acne vulgaris   . Allergic rhinitis, cause unspecified   . Anxiety   . Endometriosis dx Jan 2014  . Erythema nodosum    associated with menses; improved with OCP  . Essential hypertension, benign   . H/O pilonidal cyst   . Headache, migraine   . Obesity, unspecified     Past Surgical History:  Procedure Laterality Date  . OPEN REDUCTION INTERNAL FIXATION (ORIF) METACARPAL Right 10/28/2014   Procedure: OPEN REDUCTION INTERNAL FIXATION RIGHT FINGER METACARPAL FRACTURE;  Surgeon: Dairl Ponder, MD;  [facility]: [facility];  Service: Orthopedics;  Laterality: Right;  . ORIF RADIAL FRACTURE Right 10/28/2014   Procedure: OPEN REDUCTION INTERNAL FIXATION RIGHT RADIUS FRACTURE;  Surgeon: Dairl Ponder, MD;  Location: [facility] OR;  Service: Orthopedics;  Laterality: Right;  . ORIF SCAPHOID FRACTURE Right 10/28/2014   Procedure: OPEN REDUCTION INTERNAL FIXATION (ORIF) SCAPHOID FRACTURE;  Surgeon: Dairl Ponder, MD;  Location: [facility];  Service: Orthopedics;  Laterality: Right;  . PILONIDAL CYST EXCISION  01/30/2015   Ardeth Sportsman, MD  . WISDOM TOOTH EXTRACTION      Social History   Tobacco Use  . Smoking status: Never Smoker  . Smokeless tobacco: Never Used  Substance Use Topics  . Alcohol use: Yes    Alcohol/week: 0.6 oz  Types: 1 Cans of beer per week    Comment: rarely     Family History  Problem Relation Age of Onset  . Heart disease Father [id number]       AMI; stents  . Hypertension Father   . Hyperlipidemia Father   . Diabetes Maternal Grandmother   . Endometriosis Maternal Grandmother   . Cancer Mother        kidney  . Endometriosis Mother   . Cancer Maternal Grandfather   . Stroke Paternal Grandfather   . Hypertension Paternal Grandfather   . Hyperlipidemia Paternal Grandfather   . Heart disease Paternal Grandfather     ROS Per hpi  OBJECTIVE:  Blood pressure  134/82, pulse 93, temperature 98.6 F (37 C), temperature source Oral, height 5\' 10"  (1.778 m), weight (!) 310 lb 12.8 oz (141 kg), last menstrual period 12/29/2017, SpO2 96 %.  Physical Exam  Gen; AAOx3, NAD Left upper back: 2 small incision, no erythema, induration or drainage, packing removed, dressing was c/d/i No further packing needed Clean dressing placed   ASSESSMENT and PLAN  1. Visit for wound check Healing well. No need to repack. Complete abx course. RTC precautions given.  Return if symptoms worsen or fail to improve.    Myles LippsIrma M Santiago, MD Primary Care at [facility] [address] [place], [place] [id number] Ph.  [phone] Fax [phone]

## 2018-01-23 NOTE — Patient Instructions (Signed)
     IF you received an x-ray today, you will receive an invoice from [facility]. Please contact [facility] at [phone] with questions or concerns regarding your invoice.   IF you received labwork today, you will receive an invoice from [facility]. Please contact [facility] at [phone] with questions or concerns regarding your invoice.   Our billing staff will not be able to assist you with questions regarding bills from these companies.  You will be contacted with the lab results as soon as they are available. The fastest way to get your results is to activate your My Chart account. Instructions are located on the last page of this paperwork. If you have not heard from us regarding the results in 2 weeks, please contact this office.     

## 2018-02-07 ENCOUNTER — Encounter: Payer: 59 | Admitting: Physician Assistant

## 2018-07-22 NOTE — L&D Delivery Note (Signed)
Delivery Note  SVD viable female delivered precipitously with faculty MD in room.  I arrived at delivery.  Nuchal x 3. . Apgars 9,9 over 2nd degree lac.Marland Kitchen  Placenta delivered spontaneously intact with 3VC. Repair with 2-0 Chromic with good support and hemostasis noted.  R/V exam confirms.  PH art was not done.   Mother and baby to couplet care and are doing well.  EBL 100cc  Louretta Shorten, MD

## 2018-09-22 LAB — OB RESULTS CONSOLE ANTIBODY SCREEN: Antibody Screen: NEGATIVE

## 2018-09-22 LAB — OB RESULTS CONSOLE GC/CHLAMYDIA
Chlamydia: NEGATIVE
Gonorrhea: NEGATIVE

## 2018-09-22 LAB — OB RESULTS CONSOLE ABO/RH: RH Type: POSITIVE

## 2018-09-22 LAB — OB RESULTS CONSOLE HEPATITIS B SURFACE ANTIGEN: Hepatitis B Surface Ag: NEGATIVE

## 2018-09-22 LAB — OB RESULTS CONSOLE HIV ANTIBODY (ROUTINE TESTING): HIV: NONREACTIVE

## 2018-09-22 LAB — OB RESULTS CONSOLE RUBELLA ANTIBODY, IGM: Rubella: NON-IMMUNE/NOT IMMUNE

## 2018-09-22 LAB — OB RESULTS CONSOLE RPR: RPR: NONREACTIVE

## 2019-02-02 LAB — OB RESULTS CONSOLE HIV ANTIBODY (ROUTINE TESTING): HIV: NONREACTIVE

## 2019-04-07 LAB — OB RESULTS CONSOLE GBS: GBS: NEGATIVE

## 2019-04-19 ENCOUNTER — Telehealth (HOSPITAL_COMMUNITY): Payer: Self-pay | Admitting: *Deleted

## 2019-04-19 ENCOUNTER — Encounter (HOSPITAL_COMMUNITY): Payer: Self-pay | Admitting: *Deleted

## 2019-04-19 NOTE — Telephone Encounter (Signed)
Preadmission screen  

## 2019-04-25 ENCOUNTER — Other Ambulatory Visit: Payer: Self-pay

## 2019-04-25 ENCOUNTER — Other Ambulatory Visit (HOSPITAL_COMMUNITY)
Admission: RE | Admit: 2019-04-25 | Discharge: 2019-04-25 | Disposition: A | Discharge status: Home / Self Care | Payer: 59 | Source: Ambulatory Visit | Attending: Obstetrics & Gynecology | Admitting: Obstetrics & Gynecology

## 2019-04-25 LAB — SARS CORONAVIRUS 2 BY RT PCR (HOSPITAL ORDER, PERFORMED IN ~~LOC~~ HOSPITAL LAB): SARS Coronavirus 2: NEGATIVE

## 2019-04-25 NOTE — MAU Note (Signed)
Pt here for PAT covid swab. Denies symptoms. Swab collected.  

## 2019-04-26 MED ORDER — TERBUTALINE SULFATE 1 MG/ML IJ SOLN: 0.2500 mg | Freq: Once | INTRAMUSCULAR | Status: DC | PRN

## 2019-04-27 ENCOUNTER — Inpatient Hospital Stay (HOSPITAL_COMMUNITY): Payer: 59

## 2019-04-27 ENCOUNTER — Inpatient Hospital Stay (HOSPITAL_COMMUNITY): Payer: 59 | Admitting: Anesthesiology

## 2019-04-27 ENCOUNTER — Other Ambulatory Visit: Payer: Self-pay

## 2019-04-27 ENCOUNTER — Encounter (HOSPITAL_COMMUNITY): Payer: Self-pay

## 2019-04-27 ENCOUNTER — Inpatient Hospital Stay (HOSPITAL_COMMUNITY)
Admission: AD | Admit: 2019-04-27 | Discharge: 2019-04-29 | DRG: 807 | Disposition: A | Discharge status: Home / Self Care | Payer: 59 | Attending: Obstetrics and Gynecology | Admitting: Obstetrics and Gynecology

## 2019-04-27 DIAGNOSIS — Z3A39 39 weeks gestation of pregnancy: Secondary | ICD-10-CM | POA: Diagnosis not present

## 2019-04-27 DIAGNOSIS — O3483 Maternal care for other abnormalities of pelvic organs, third trimester: Secondary | ICD-10-CM | POA: Diagnosis present

## 2019-04-27 DIAGNOSIS — Z20828 Contact with and (suspected) exposure to other viral communicable diseases: Secondary | ICD-10-CM | POA: Diagnosis present

## 2019-04-27 DIAGNOSIS — N83291 Other ovarian cyst, right side: Secondary | ICD-10-CM | POA: Diagnosis present

## 2019-04-27 DIAGNOSIS — O1002 Pre-existing essential hypertension complicating childbirth: Secondary | ICD-10-CM | POA: Diagnosis present

## 2019-04-27 DIAGNOSIS — Z3403 Encounter for supervision of normal first pregnancy, third trimester: Secondary | ICD-10-CM

## 2019-04-27 DIAGNOSIS — Z23 Encounter for immunization: Secondary | ICD-10-CM | POA: Diagnosis not present

## 2019-04-27 DIAGNOSIS — Z3490 Encounter for supervision of normal pregnancy, unspecified, unspecified trimester: Secondary | ICD-10-CM | POA: Diagnosis present

## 2019-04-27 LAB — TYPE AND SCREEN
ABO/RH(D): A POS
Antibody Screen: NEGATIVE

## 2019-04-27 LAB — CBC
HCT: 35.7 % — ABNORMAL LOW (ref 36.0–46.0)
Hemoglobin: 12 g/dL (ref 12.0–15.0)
MCH: 29.2 pg (ref 26.0–34.0)
MCHC: 33.6 g/dL (ref 30.0–36.0)
MCV: 86.9 fL (ref 80.0–100.0)
Platelets: 290 10*3/uL (ref 150–400)
RBC: 4.11 MIL/uL (ref 3.87–5.11)
RDW: 16.3 % — ABNORMAL HIGH (ref 11.5–15.5)
WBC: 7.7 10*3/uL (ref 4.0–10.5)
nRBC: 0 % (ref 0.0–0.2)

## 2019-04-27 LAB — ABO/RH: ABO/RH(D): A POS

## 2019-04-27 MED ORDER — OXYCODONE-ACETAMINOPHEN 5-325 MG PO TABS: 2.0000 | ORAL_TABLET | ORAL | Status: DC | PRN

## 2019-04-27 MED ORDER — OXYTOCIN BOLUS FROM INFUSION
500.0000 mL | Freq: Once | INTRAVENOUS | Status: AC
  Administered 2019-04-28: 500 mL via INTRAVENOUS

## 2019-04-27 MED ORDER — EPHEDRINE 5 MG/ML INJ: 10.0000 mg | INTRAVENOUS | Status: DC | PRN

## 2019-04-27 MED ORDER — OXYTOCIN 40 UNITS IN NORMAL SALINE INFUSION - SIMPLE MED
1.0000 m[IU]/min | INTRAVENOUS | Status: DC
  Administered 2019-04-27: 2 m[IU]/min via INTRAVENOUS
  Filled 2019-04-27: qty 1000

## 2019-04-27 MED ORDER — ONDANSETRON HCL 4 MG/2ML IJ SOLN: 4.0000 mg | Freq: Four times a day (QID) | INTRAMUSCULAR | Status: DC | PRN

## 2019-04-27 MED ORDER — FENTANYL-BUPIVACAINE-NACL 0.5-0.125-0.9 MG/250ML-% EP SOLN
12.0000 mL/h | EPIDURAL | Status: DC | PRN
  Filled 2019-04-27: qty 250

## 2019-04-27 MED ORDER — SOD CITRATE-CITRIC ACID 500-334 MG/5ML PO SOLN: 30.0000 mL | ORAL | Status: DC | PRN

## 2019-04-27 MED ORDER — OXYCODONE-ACETAMINOPHEN 5-325 MG PO TABS: 1.0000 | ORAL_TABLET | ORAL | Status: DC | PRN

## 2019-04-27 MED ORDER — TERBUTALINE SULFATE 1 MG/ML IJ SOLN: 0.2500 mg | Freq: Once | INTRAMUSCULAR | Status: DC | PRN

## 2019-04-27 MED ORDER — PHENYLEPHRINE 40 MCG/ML (10ML) SYRINGE FOR IV PUSH (FOR BLOOD PRESSURE SUPPORT): 80.0000 ug | PREFILLED_SYRINGE | INTRAVENOUS | Status: DC | PRN

## 2019-04-27 MED ORDER — LIDOCAINE HCL (PF) 1 % IJ SOLN
INTRAMUSCULAR | Status: DC | PRN
  Administered 2019-04-27: 5 mL via EPIDURAL

## 2019-04-27 MED ORDER — LIDOCAINE HCL (PF) 1 % IJ SOLN: 30.0000 mL | INTRAMUSCULAR | Status: DC | PRN

## 2019-04-27 MED ORDER — ACETAMINOPHEN 325 MG PO TABS: 650.0000 mg | ORAL_TABLET | ORAL | Status: DC | PRN

## 2019-04-27 MED ORDER — ZOLPIDEM TARTRATE 5 MG PO TABS: 5.0000 mg | ORAL_TABLET | Freq: Every evening | ORAL | Status: DC | PRN

## 2019-04-27 MED ORDER — OXYTOCIN 40 UNITS IN NORMAL SALINE INFUSION - SIMPLE MED: 2.5000 [IU]/h | INTRAVENOUS | Status: DC

## 2019-04-27 MED ORDER — DIPHENHYDRAMINE HCL 50 MG/ML IJ SOLN: 12.5000 mg | INTRAMUSCULAR | Status: DC | PRN

## 2019-04-27 MED ORDER — LACTATED RINGERS IV SOLN: 500.0000 mL | INTRAVENOUS | Status: DC | PRN

## 2019-04-27 MED ORDER — SODIUM CHLORIDE (PF) 0.9 % IJ SOLN
INTRAMUSCULAR | Status: DC | PRN
  Administered 2019-04-27: 12 mL/h via EPIDURAL

## 2019-04-27 MED ORDER — LACTATED RINGERS IV SOLN
INTRAVENOUS | Status: DC
  Administered 2019-04-27 (×2): via INTRAVENOUS

## 2019-04-27 MED ORDER — BUTORPHANOL TARTRATE 1 MG/ML IJ SOLN: 1.0000 mg | INTRAMUSCULAR | Status: DC | PRN

## 2019-04-27 MED ORDER — LACTATED RINGERS IV SOLN
500.0000 mL | Freq: Once | INTRAVENOUS | Status: AC
  Administered 2019-04-27: 500 mL via INTRAVENOUS

## 2019-04-27 NOTE — H&P (Signed)
Kimberly Glass is a 33 y.o. female presenting for IOL due to [facility] on labetalol.  No PIH sxs.  Last week US shows EFW 7 1/2 lbs, also large right ov cyst 19cm to be treated and evaluated PP by Dr Denman George.  GBS -. OB History    Gravida  1   Para  0   Term  0   Preterm  0   AB  0   Living  0     SAB  0   TAB  0   Ectopic  0   Multiple  0   Live Births             Past Medical History:  Diagnosis Date  . Acne vulgaris   . Allergic rhinitis, cause unspecified   . Anxiety   . Endometriosis dx Jan 2014  . Erythema nodosum    associated with menses; improved with OCP  . Essential hypertension, benign   . H/O pilonidal cyst   . Headache, migraine   . Obesity, unspecified    Past Surgical History:  Procedure Laterality Date  . OPEN REDUCTION INTERNAL FIXATION (ORIF) METACARPAL Right 10/28/2014   Procedure: OPEN REDUCTION INTERNAL FIXATION RIGHT FINGER METACARPAL FRACTURE;  Surgeon: Charlotte Crumb, MD;  Location: [place];  Service: Orthopedics;  Laterality: Right;  . ORIF RADIAL FRACTURE Right 10/28/2014   Procedure: OPEN REDUCTION INTERNAL FIXATION RIGHT RADIUS FRACTURE;  Surgeon: Charlotte Crumb, MD;  Location: [facility];  Service: Orthopedics;  Laterality: Right;  . ORIF SCAPHOID FRACTURE Right 10/28/2014   Procedure: OPEN REDUCTION INTERNAL FIXATION (ORIF) SCAPHOID FRACTURE;  Surgeon: Charlotte Crumb, MD;  Location: [place];  Service: Orthopedics;  Laterality: Right;  . PILONIDAL CYST EXCISION  01/30/2015   Adin Hector, MD  . WISDOM TOOTH EXTRACTION     Family History: family history includes Cancer in her maternal grandfather and mother; Diabetes in her maternal grandmother; Endometriosis in her maternal grandmother and mother; Heart disease in her paternal grandfather; Heart disease (age of onset: 35) in her father; Hyperlipidemia in her father and paternal grandfather; Hypertension in her father and paternal grandfather; Stroke in her paternal grandfather. Social History:   reports that she has never smoked. She has never used smokeless tobacco. She reports current alcohol use of about 1.0 standard drinks of alcohol per week. She reports that she does not use drugs.     Maternal Diabetes: No Genetic Screening: Normal Maternal Ultrasounds/Referrals: Normal Fetal Ultrasounds or other Referrals:  None Maternal Substance Abuse:  No Significant Maternal Medications:  None Significant Maternal Lab Results:  Group B Strep negative Other Comments:  None  ROS History Dilation: 4 Effacement (%): 60 Station: -2 Exam by:: Dr. Corinna Capra Blood pressure 138/85, pulse (!) 105, temperature 99.8 F (37.7 C), temperature source Oral, resp. rate 16, height 5\' 10"  (1.778 m), weight (!) 142.4 kg. Exam Physical Exam  Prenatal labs: ABO, Rh: --/--/A POS (10/06 1419) Antibody: NEG (10/06 1419) Rubella: Nonimmune (03/03 0000) RPR: Nonreactive (03/03 0000)  HBsAg: Negative (03/03 0000)  HIV: Non-reactive (07/14 0000)  GBS: Negative/-- (09/16 0000)   Assessment/Plan: IUP at 39 weeks CHTN controlled with meds AROM and Pit.  Anticipate SVD Right ov cyst- to be managed PP by Gyn Spero Geralds 04/27/2019, 4:21 PM

## 2019-04-27 NOTE — Anesthesia Preprocedure Evaluation (Signed)
Anesthesia Evaluation  Patient identified by MRN, date of birth, ID band Patient awake    Reviewed: Allergy & Precautions, NPO status , Patient's Chart, lab work & pertinent test results  Airway Mallampati: II  TM Distance: >3 FB Neck ROM: Full    Dental no notable dental hx. (+) Teeth Intact   Pulmonary neg pulmonary ROS,    Pulmonary exam normal breath sounds clear to auscultation       Cardiovascular hypertension, Pt. on medications Normal cardiovascular exam Rhythm:Regular Rate:Normal     Neuro/Psych  Headaches, Anxiety    GI/Hepatic Neg liver ROS,   Endo/Other  negative endocrine ROS  Renal/GU negative Renal ROS     Musculoskeletal   Abdominal (+) + obese,   Peds  Hematology Hgb 12.0 Plt 290   Anesthesia Other Findings   Reproductive/Obstetrics (+) Pregnancy                            Anesthesia Physical Anesthesia Plan  ASA: III  Anesthesia Plan: Epidural   Post-op Pain Management:    Induction:   PONV Risk Score and Plan:   Airway Management Planned:   Additional Equipment:   Intra-op Plan:   Post-operative Plan:   Informed Consent: I have reviewed the patients History and Physical, chart, labs and discussed the procedure including the risks, benefits and alternatives for the proposed anesthesia with the patient or authorized representative who has indicated his/her understanding and acceptance.     Dental advisory given  Plan Discussed with: CRNA  Anesthesia Plan Comments: (G1P0 w PIH for LEA)        Anesthesia Quick Evaluation

## 2019-04-27 NOTE — Anesthesia Procedure Notes (Signed)
Epidural Patient location during procedure: OB Start time: 04/27/2019 6:31 PM End time: 04/27/2019 6:41 PM  Staffing Anesthesiologist: Barnet Glasgow, MD Performed: anesthesiologist   Preanesthetic Checklist Completed: patient identified, site marked, surgical consent, pre-op evaluation, timeout performed, IV checked, risks and benefits discussed and monitors and equipment checked  Epidural Patient position: sitting Prep: site prepped and draped and DuraPrep Patient monitoring: continuous pulse ox and blood pressure Approach: midline Location: L3-L4 Injection technique: LOR air  Needle:  Needle type: Tuohy  Needle gauge: 17 G Needle length: 9 cm and 9 Needle insertion depth: 5 cm cm Catheter type: closed end flexible Catheter size: 19 Gauge Catheter at skin depth: 14 cm Test dose: negative  Assessment Events: blood not aspirated, injection not painful, no injection resistance, negative IV test and no paresthesia  Additional Notes Patient identified. Risks/Benefits/Options discussed with patient including but not limited to bleeding, infection, nerve damage, paralysis, failed block, incomplete pain control, headache, blood pressure changes, nausea, vomiting, reactions to medication both or allergic, itching and postpartum back pain. Confirmed with bedside nurse the patient's most recent platelet count. Confirmed with patient that they are not currently taking any anticoagulation, have any bleeding history or any family history of bleeding disorders. Patient expressed understanding and wished to proceed. All questions were answered. Sterile technique was used throughout the entire procedure. Please see nursing notes for vital signs. Test dose was given through epidural needle and negative prior to continuing to dose epidural or start infusion. Warning signs of high block given to the patient including shortness of breath, tingling/numbness in hands, complete motor block, or any  concerning symptoms with instructions to call for help. Patient was given instructions on fall risk and not to get out of bed. All questions and concerns addressed with instructions to call with any issues. 3 Attempt (S) . Patient tolerated procedure well.

## 2019-04-28 ENCOUNTER — Encounter (HOSPITAL_COMMUNITY): Payer: Self-pay

## 2019-04-28 LAB — CBC
HCT: 30.6 % — ABNORMAL LOW (ref 36.0–46.0)
HCT: 27.8 % — ABNORMAL LOW (ref 36.0–46.0)
Hemoglobin: 10.4 g/dL — ABNORMAL LOW (ref 12.0–15.0)
Hemoglobin: 9.5 g/dL — ABNORMAL LOW (ref 12.0–15.0)
MCH: 29.3 pg (ref 26.0–34.0)
MCH: 29.4 pg (ref 26.0–34.0)
MCHC: 34 g/dL (ref 30.0–36.0)
MCHC: 34.2 g/dL (ref 30.0–36.0)
MCV: 86.2 fL (ref 80.0–100.0)
MCV: 86.1 fL (ref 80.0–100.0)
Platelets: 285 10*3/uL (ref 150–400)
Platelets: 264 10*3/uL (ref 150–400)
RBC: 3.55 MIL/uL — ABNORMAL LOW (ref 3.87–5.11)
RBC: 3.23 MIL/uL — ABNORMAL LOW (ref 3.87–5.11)
RDW: 16.2 % — ABNORMAL HIGH (ref 11.5–15.5)
RDW: 16.2 % — ABNORMAL HIGH (ref 11.5–15.5)
WBC: 15.7 10*3/uL — ABNORMAL HIGH (ref 4.0–10.5)
WBC: 13.4 10*3/uL — ABNORMAL HIGH (ref 4.0–10.5)
nRBC: 0 % (ref 0.0–0.2)
nRBC: 0 % (ref 0.0–0.2)

## 2019-04-28 LAB — RPR: RPR Ser Ql: NONREACTIVE

## 2019-04-28 MED ORDER — LACTATED RINGERS IV BOLUS
500.0000 mL | Freq: Once | INTRAVENOUS | Status: AC
  Administered 2019-04-28: 500 mL via INTRAVENOUS

## 2019-04-28 MED ORDER — LABETALOL HCL 200 MG PO TABS
200.0000 mg | ORAL_TABLET | Freq: Two times a day (BID) | ORAL | Status: DC
  Administered 2019-04-28: 200 mg via ORAL
  Filled 2019-04-28 (×2): qty 1

## 2019-04-28 MED ORDER — PRENATAL MULTIVITAMIN CH
1.0000 | ORAL_TABLET | Freq: Every day | ORAL | Status: DC
  Administered 2019-04-28 – 2019-04-29 (×2): 1 via ORAL
  Filled 2019-04-28 (×2): qty 1

## 2019-04-28 MED ORDER — ONDANSETRON HCL 4 MG PO TABS: 4.0000 mg | ORAL_TABLET | ORAL | Status: DC | PRN

## 2019-04-28 MED ORDER — OXYCODONE-ACETAMINOPHEN 5-325 MG PO TABS: 2.0000 | ORAL_TABLET | ORAL | Status: DC | PRN

## 2019-04-28 MED ORDER — INFLUENZA VAC SPLIT QUAD 0.5 ML IM SUSY
0.5000 mL | PREFILLED_SYRINGE | INTRAMUSCULAR | Status: AC
  Administered 2019-04-29: 0.5 mL via INTRAMUSCULAR
  Filled 2019-04-28: qty 0.5

## 2019-04-28 MED ORDER — SIMETHICONE 80 MG PO CHEW: 80.0000 mg | CHEWABLE_TABLET | ORAL | Status: DC | PRN

## 2019-04-28 MED ORDER — COCONUT OIL OIL: 1.0000 "application " | TOPICAL_OIL | Status: DC | PRN

## 2019-04-28 MED ORDER — DIBUCAINE (PERIANAL) 1 % EX OINT: 1.0000 "application " | TOPICAL_OINTMENT | CUTANEOUS | Status: DC | PRN

## 2019-04-28 MED ORDER — BENZOCAINE-MENTHOL 20-0.5 % EX AERO
1.0000 "application " | INHALATION_SPRAY | CUTANEOUS | Status: DC | PRN
  Administered 2019-04-29: 1 via TOPICAL
  Filled 2019-04-28: qty 56

## 2019-04-28 MED ORDER — ACETAMINOPHEN 325 MG PO TABS: 650.0000 mg | ORAL_TABLET | ORAL | Status: DC | PRN

## 2019-04-28 MED ORDER — ZOLPIDEM TARTRATE 5 MG PO TABS: 5.0000 mg | ORAL_TABLET | Freq: Every evening | ORAL | Status: DC | PRN

## 2019-04-28 MED ORDER — MEASLES, MUMPS & RUBELLA VAC IJ SOLR
0.5000 mL | Freq: Once | INTRAMUSCULAR | Status: AC
  Administered 2019-04-29: 0.5 mL via SUBCUTANEOUS
  Filled 2019-04-28: qty 0.5

## 2019-04-28 MED ORDER — WITCH HAZEL-GLYCERIN EX PADS: 1.0000 "application " | MEDICATED_PAD | CUTANEOUS | Status: DC | PRN

## 2019-04-28 MED ORDER — OXYCODONE-ACETAMINOPHEN 5-325 MG PO TABS: 1.0000 | ORAL_TABLET | ORAL | Status: DC | PRN

## 2019-04-28 MED ORDER — TETANUS-DIPHTH-ACELL PERTUSSIS 5-2.5-18.5 LF-MCG/0.5 IM SUSP: 0.5000 mL | Freq: Once | INTRAMUSCULAR | Status: DC

## 2019-04-28 MED ORDER — ONDANSETRON HCL 4 MG/2ML IJ SOLN: 4.0000 mg | INTRAMUSCULAR | Status: DC | PRN

## 2019-04-28 MED ORDER — IBUPROFEN 600 MG PO TABS
600.0000 mg | ORAL_TABLET | Freq: Four times a day (QID) | ORAL | Status: DC
  Administered 2019-04-28 – 2019-04-29 (×6): 600 mg via ORAL
  Filled 2019-04-28 (×6): qty 1

## 2019-04-28 MED ORDER — LABETALOL HCL 200 MG PO TABS
200.0000 mg | ORAL_TABLET | Freq: Once | ORAL | Status: AC
  Administered 2019-04-28: 200 mg via ORAL
  Filled 2019-04-28: qty 1

## 2019-04-28 MED ORDER — LACTATED RINGERS IV SOLN
INTRAVENOUS | Status: DC
  Administered 2019-04-28: 12:00:00 via INTRAVENOUS

## 2019-04-28 MED ORDER — SENNOSIDES-DOCUSATE SODIUM 8.6-50 MG PO TABS
2.0000 | ORAL_TABLET | ORAL | Status: DC
  Administered 2019-04-28: 2 via ORAL
  Filled 2019-04-28: qty 2

## 2019-04-28 MED ORDER — LABETALOL HCL 200 MG PO TABS
200.0000 mg | ORAL_TABLET | Freq: Two times a day (BID) | ORAL | Status: DC
  Administered 2019-04-28 – 2019-04-29 (×2): 200 mg via ORAL
  Filled 2019-04-28 (×2): qty 1

## 2019-04-28 NOTE — Lactation Note (Signed)
This note was copied from a baby's chart. Lactation Consultation Note  Patient Name: Kimberly Glass [id number] Date: 04/28/2019 Reason for consult: Follow-up assessment;Term;[place] Baby is 62 hours old.  She has been very spitty.  Mom changed a meconium stool and then baby spit a moderate amount of clear fluid.  Assisted with positioning baby in football hold on right side.  Breasts are large.  Right nipple short shafted but compressible and left nipple flat and less compressible.  Baby opens wide and with tea cup hold baby latches well.  She doesn't sustain latch more than a few minutes but relatches easily.  Baby fed off and on for 10 minutes.  Instructed to feed with cues and call for assist prn.  Maternal Data    Feeding Feeding Type: Breast Fed  LATCH Score Latch: Repeated attempts needed to sustain latch, nipple held in mouth throughout feeding, stimulation needed to elicit sucking reflex.  Audible Swallowing: A few with stimulation  Type of Nipple: Everted at rest and after stimulation  Comfort (Breast/Nipple): Filling, red/small blisters or bruises, mild/mod discomfort  Hold (Positioning): Assistance needed to correctly position infant at breast and maintain latch.  LATCH Score: 6  Interventions Interventions: Hand pump  Lactation Tools Discussed/Used     Consult Status Consult Status: Follow-up Date: 04/29/19 Follow-up type: In-patient    Kimberly Glass 04/28/2019, 1:23 PM

## 2019-04-28 NOTE — Lactation Note (Signed)
This note was copied from a baby's chart. Lactation Consultation Note  Patient Name: Kimberly Glass Date: 04/28/2019 Reason for consult: Initial assessment;1st time breastfeeding;Term P1, 4 hour female infant. LC entered room infant asleep in basinet. Mom feels breastfeeding is going well. Mom has Spectra 2 DEBP at home. Parents have been doing  STS Per mom, infant did not latch in L&D but breastfed 11 minutes when mom arrived  In her room. LC did not observed latch , Per mom, she breastfeed infant less than 2 hours for 40 minutes prior to [facility] entering  the room.  Mom knows to call Nurse or [place]  if she has any questions, concerns or need assistance to latch infant to breast. Mom knows to breastfeed infant according hunger cues, 8 to 12 times within 24 hours. Reviewed Baby & Me book's Breastfeeding Basics.  Mom made aware of O/P services, breastfeeding support groups, community resources, and our phone # for post-discharge questions.     Maternal Data Formula Feeding for Exclusion: No Has patient been taught Hand Expression?: Yes Does the patient have breastfeeding experience prior to this delivery?: No  Feeding    LATCH Score                   Interventions Interventions: Breast feeding basics reviewed;Hand express;Position options;Skin to skin  Lactation Tools Discussed/Used WIC Program: No   Consult Status Consult Status: Follow-up Date: 04/28/19 Follow-up type: In-patient    Vicente Serene 04/28/2019, 5:22 AM

## 2019-04-28 NOTE — Progress Notes (Signed)
She was just up to BR doing much better. "I feel a lot better". Breast feeding   Today's Vitals   04/28/19 0923 04/28/19 0937 04/28/19 1137 04/28/19 1200  BP: 131/86 124/86    Pulse: 99 (!) 112    Resp: 16 16    Temp: 98.8 F (37.1 C)     TempSrc: Oral     SpO2: 99%     Weight:      Height:      PainSc:  0-No pain 0-No pain 0-No pain   Body mass index is 45.05 kg/m.   Results for orders placed or performed during the hospital encounter of 04/27/19 (from the past 24 hour(s))  CBC     Status: Abnormal   Collection Time: 04/27/19  2:19 PM  Result Value Ref Range   WBC 7.7 4.0 - 10.5 K/uL   RBC 4.11 3.87 - 5.11 MIL/uL   Hemoglobin 12.0 12.0 - 15.0 g/dL   HCT 35.7 (L) 36.0 - 46.0 %   MCV 86.9 80.0 - 100.0 fL   MCH 29.2 26.0 - 34.0 pg   MCHC 33.6 30.0 - 36.0 g/dL   RDW 16.3 (H) 11.5 - 15.5 %   Platelets 290 150 - 400 K/uL   nRBC 0.0 0.0 - 0.2 %  RPR     Status: None   Collection Time: 04/27/19  2:19 PM  Result Value Ref Range   RPR Ser Ql NON REACTIVE NON REACTIVE  Type and screen [place]     Status: None   Collection Time: 04/27/19  2:19 PM  Result Value Ref Range   ABO/RH(D) A POS    Antibody Screen NEG    Sample Expiration      04/30/2019,2359 Performed at [facility], [place] [address] [place], [place] [address]   ABO/Rh     Status: None   Collection Time: 04/27/19  2:19 PM  Result Value Ref Range   ABO/RH(D)      A POS Performed at [facility] [address] [place], [place] [address]   CBC     Status: Abnormal   Collection Time: 04/28/19  6:09 AM  Result Value Ref Range   WBC 15.7 (H) 4.0 - 10.5 K/uL   RBC 3.55 (L) 3.87 - 5.11 MIL/uL   Hemoglobin 10.4 (L) 12.0 - 15.0 g/dL   HCT 30.6 (L) 36.0 - 46.0 %   MCV 86.2 80.0 - 100.0 fL   MCH 29.3 26.0 - 34.0 pg   MCHC 34.0 30.0 - 36.0 g/dL   RDW 16.2 (H) 11.5 - 15.5 %   Platelets 285 150 - 400 K/uL   nRBC 0.0 0.0 - 0.2 %  CBC     Status: Abnormal   Collection Time: 04/28/19  11:44 AM  Result Value Ref Range   WBC 13.4 (H) 4.0 - 10.5 K/uL   RBC 3.23 (L) 3.87 - 5.11 MIL/uL   Hemoglobin 9.5 (L) 12.0 - 15.0 g/dL   HCT 27.8 (L) 36.0 - 46.0 %   MCV 86.1 80.0 - 100.0 fL   MCH 29.4 26.0 - 34.0 pg   MCHC 34.2 30.0 - 36.0 g/dL   RDW 16.2 (H) 11.5 - 15.5 %   Platelets 264 150 - 400 K/uL   nRBC 0.0 0.0 - 0.2 %    A/P: Hypovolemia now doing better with IV fluids         Will run IV fluids until 5 pm and then saline  lock

## 2019-04-28 NOTE — Progress Notes (Signed)
MOB was referred for history of depression/anxiety. * Referral screened out by Clinical Social Worker because none of the following criteria appear to apply: ~ History of anxiety/depression during this pregnancy, or of post-partum depression following prior delivery. ~ Diagnosis of anxiety and/or depression within last 3 years. MOB diagnosed with anxiety in 2014 per MOB's chart review.  OR * MOB's symptoms currently being treated with medication and/or therapy.    Please contact the Clinical Social Worker if needs arise, by MOB request, or if MOB scores greater than 9/yes to question 10 on Edinburgh Postpartum Depression Screen.     Kimberly Glass, MSW, LCSW [facility] [phone]   

## 2019-04-28 NOTE — Progress Notes (Signed)
Patient up to void with RN assistance. Much better this time. Used stedy to the bathroom, Was able to walk back to the bed herself, per request.Reported little lightheaded. LR still running.  Spoke brief with Dr. Gaetano Net on unit. Saline lock her IV after LR bag infuses. Will continue to observe.

## 2019-04-28 NOTE — Anesthesia Postprocedure Evaluation (Signed)
Anesthesia Post Note  Patient: Kimberly Glass  Procedure(s) Performed: AN AD HOC LABOR EPIDURAL     Patient location during evaluation: Mother Baby Anesthesia Type: Epidural Level of consciousness: awake and alert Pain management: pain level controlled Vital Signs Assessment: post-procedure vital signs reviewed and stable Respiratory status: spontaneous breathing, nonlabored ventilation and respiratory function stable Cardiovascular status: stable Postop Assessment: no headache, no backache and epidural receding Anesthetic complications: no    Last Vitals:  Vitals:   04/28/19 0420 04/28/19 0530  BP: 111/70 114/81  Pulse: (!) 124 100  Resp: 16 16  Temp: 37.4 C   SpO2: 97% 98%    Last Pain:  Vitals:   04/28/19 0421  TempSrc:   PainSc: 1    Pain Goal:                   [employer]

## 2019-04-28 NOTE — Progress Notes (Signed)
Patient up to bathroom with RN assistance for first void. Patient reports dizziness, lightheaded and ringing ears when sitting on toilet, appears pale in color.  Yellow pad heavy in lochia. Another pad and ice pack seen in trashcan to be saturated as well from nightshift. Called for 2nd RN assistance, back to bed with stedy device.  BP reassessed WNL. Fundal check WNL. Triton measurement 401 EBL.  Dr. Gaetano Net notified with new orders placed, in addition to safety order of up to bathroom with assistance and also to HOLD Labetalol medication for now.

## 2019-04-28 NOTE — Progress Notes (Signed)
Post Partum Day 0 Subjective: no complaints, up ad lib and tolerating PO  Objective: Blood pressure 114/81, pulse 100, temperature 99.4 F (37.4 C), temperature source Oral, resp. rate 16, height 5\' 10"  (1.778 m), weight (!) 142.4 kg, SpO2 98 %, unknown if currently breastfeeding.  Physical Exam:  General: alert, cooperative and no distress Lochia: appropriate Uterine Fundus: firm Incision: healing well DVT Evaluation: No evidence of DVT seen on physical exam.  Recent Labs    04/27/19 1419 04/28/19 0609  HGB 12.0 10.4*  HCT 35.7* 30.6*    Assessment/Plan: Plan for discharge tomorrow   LOS: 1 day   Shon Millet II 04/28/2019, 7:30 AM

## 2019-04-28 NOTE — Progress Notes (Signed)
pt moved self up in bed then stated that she felt "light headed and dizzy". Pt also stated " feels like I am going to pass out".collected pt BP it was 69/43. Pt continued to speak to nurse . Sat pt up to semi-fowlers position. Continued to stay at pt bedside along with Brooke,RN. Collected BP again it was 97/78. Pt stated that I am starting to feel a little better. Informed pt/fob to please call if she feels light headed again. Will follow-up on pt vitals and status.

## 2019-04-29 LAB — CBC
HCT: 26.8 % — ABNORMAL LOW (ref 36.0–46.0)
Hemoglobin: 8.8 g/dL — ABNORMAL LOW (ref 12.0–15.0)
MCH: 28.9 pg (ref 26.0–34.0)
MCHC: 32.8 g/dL (ref 30.0–36.0)
MCV: 87.9 fL (ref 80.0–100.0)
Platelets: 309 10*3/uL (ref 150–400)
RBC: 3.05 MIL/uL — ABNORMAL LOW (ref 3.87–5.11)
RDW: 16.7 % — ABNORMAL HIGH (ref 11.5–15.5)
WBC: 10.3 10*3/uL (ref 4.0–10.5)
nRBC: 0 % (ref 0.0–0.2)

## 2019-04-29 MED ORDER — LABETALOL HCL 200 MG PO TABS: 200.0000 mg | ORAL_TABLET | Freq: Two times a day (BID) | ORAL | 1 refills | Status: AC

## 2019-04-29 MED ORDER — FERROUS SULFATE 325 (65 FE) MG PO TABS: 325.0000 mg | ORAL_TABLET | Freq: Every day | ORAL | 0 refills | Status: DC

## 2019-04-29 MED ORDER — FERROUS SULFATE 325 (65 FE) MG PO TABS: 325.0000 mg | ORAL_TABLET | Freq: Every day | ORAL | Status: DC

## 2019-04-29 MED ORDER — IBUPROFEN 600 MG PO TABS: 600.0000 mg | ORAL_TABLET | Freq: Four times a day (QID) | ORAL | 0 refills | Status: DC

## 2019-04-29 NOTE — Lactation Note (Signed)
This note was copied from a baby's chart. Lactation Consultation Note  Patient Name: Girl Kyndal Heringer [id number] Date: 04/29/2019 Reason for consult: Difficult latch;Primapara;Term Baby is 11 hours old.  Mom states baby is feeding well on right breast but having difficulty with latch on left.  Left nipple is flat.  Assisted with positioning baby in football hold on left.  Baby opens wide and latches but falls off after a few sucks.  This was repeated but baby unable to sustain latch.  20 mm nipple shield applied.  Baby latched after a few attempts and actively fed.  A few swallows noted.  Discussed milk coming to volume and the prevention and treatment of engorgement.  Mom has a Spectra pump for home use.  Symphony pump set up to post pump for stimulation. .  Baby fed for 25 minutes and colostrum in shield when baby came off.  Pumping initiated.  Instructed to continue to breastfeed with cues and post pump every 3 hours x 15 minutes giving any breastmilk back to baby.  Reviewed outpatient services and encouraged to call prn.  Maternal Data    Feeding Feeding Type: Breast Fed  LATCH Score Latch: Grasps breast easily, tongue down, lips flanged, rhythmical sucking.(with nipple shield)  Audible Swallowing: A few with stimulation  Type of Nipple: Flat  Comfort (Breast/Nipple): Soft / non-tender  Hold (Positioning): Assistance needed to correctly position infant at breast and maintain latch.  LATCH Score: 7  Interventions Interventions: Breast compression;Assisted with latch;Adjust position;Support pillows;DEBP;Breast massage  Lactation Tools Discussed/Used Tools: Nipple Shields Nipple shield size: 20 Pump Review: Setup, frequency, and cleaning;Milk Storage Initiated by:: [email or web address] Date initiated:: 04/29/19   Consult Status Consult Status: Complete Follow-up type: Call as needed    Ave Filter 04/29/2019, 8:59 AM

## 2019-04-29 NOTE — Progress Notes (Signed)
Post Partum Day 1 Subjective: no complaints, up ad lib, voiding and tolerating PO.  No dizziness or shortness of breath.  Ambulating well.  Objective: Blood pressure 139/79, pulse 83, temperature 98.3 F (36.8 C), temperature source Oral, resp. rate 18, height 5\' 10"  (1.778 m), weight (!) 142.4 kg, SpO2 98 %, unknown if currently breastfeeding.  Physical Exam:  General: alert, cooperative and appears stated age Lochia: appropriate Uterine Fundus: firm Incision: healing well, no significant drainage, no dehiscence DVT Evaluation: No evidence of DVT seen on physical exam. Negative Homan's sign. No cords or calf tenderness.  Recent Labs    04/28/19 1144 04/29/19 0539  HGB 9.5* 8.8*  HCT 27.8* 26.8*    Assessment/Plan: Discharge home and Breastfeeding   LOS: 2 days   Linda Hedges 04/29/2019, 10:03 AM

## 2019-04-29 NOTE — Discharge Summary (Signed)
Obstetric Discharge Summary Reason for Admission: induction of labor for [facility] Prenatal Procedures: none Intrapartum Procedures: spontaneous vaginal delivery Postpartum Procedures: none Complications-Operative and Postpartum: 2nd degree perineal laceration Hemoglobin  Date Value Ref Range Status  04/29/2019 8.8 (L) 12.0 - 15.0 g/dL Final  12/31/2016 13.6 11.1 - 15.9 g/dL Final   HCT  Date Value Ref Range Status  04/29/2019 26.8 (L) 36.0 - 46.0 % Final   Hematocrit  Date Value Ref Range Status  12/31/2016 40.0 34.0 - 46.6 % Final    Physical Exam:  General: alert, cooperative and appears stated age 33: appropriate Uterine Fundus: firm Incision: healing well, no significant drainage, no dehiscence DVT Evaluation: No evidence of DVT seen on physical exam. Negative Homan's sign. No cords or calf tenderness.  Discharge Diagnoses: Term Pregnancy-delivered and [facility]  Discharge Information: Date: 04/29/2019 Activity: pelvic rest Diet: routine Medications: PNV, Ibuprofen, Iron and Labetalol Condition: stable Instructions: refer to practice specific booklet Discharge to: home   Newborn Data: Live born female  Birth Weight: 7 lb 15.2 oz (3606 g) APGAR: 26, 9  Newborn Delivery   Birth date/time: 04/28/2019 01:13:00 Delivery type: Vaginal, Spontaneous      Home with mother.  Linda Hedges 04/29/2019, 10:07 AM

## 2019-04-29 NOTE — Discharge Instructions (Signed)
Call MD for T>100.4, heavy vaginal bleeding, severe abdominal pain, or respiratory distress.  Call office to schedule postpartum visit in 6 weeks.  Pelvic rest x 6 weeks.   

## 2019-05-05 ENCOUNTER — Inpatient Hospital Stay (HOSPITAL_COMMUNITY): Admission: AD | Admit: 2019-05-05 | Payer: 59 | Source: Home / Self Care

## 2019-07-06 ENCOUNTER — Other Ambulatory Visit: Payer: Self-pay | Admitting: Obstetrics and Gynecology

## 2019-07-06 DIAGNOSIS — N281 Cyst of kidney, acquired: Secondary | ICD-10-CM

## 2019-07-13 ENCOUNTER — Other Ambulatory Visit: Payer: Self-pay

## 2019-07-13 ENCOUNTER — Ambulatory Visit
Admission: RE | Admit: 2019-07-13 | Discharge: 2019-07-13 | Disposition: A | Discharge status: Home / Self Care | Payer: 59 | Source: Ambulatory Visit | Attending: Obstetrics and Gynecology | Admitting: Obstetrics and Gynecology

## 2019-07-13 DIAGNOSIS — N281 Cyst of kidney, acquired: Secondary | ICD-10-CM

## 2019-07-13 MED ORDER — IOPAMIDOL (ISOVUE-300) INJECTION 61%
100.0000 mL | Freq: Once | INTRAVENOUS | Status: AC | PRN
  Administered 2019-07-13: 100 mL via INTRAVENOUS

## 2020-08-02 ENCOUNTER — Other Ambulatory Visit: Payer: Self-pay | Admitting: Urology

## 2020-08-31 NOTE — Patient Instructions (Addendum)
DUE TO COVID-19 ONLY ONE VISITOR IS ALLOWED TO COME WITH YOU AND STAY IN THE WAITING ROOM ONLY DURING PRE OP AND PROCEDURE DAY OF SURGERY. THE 1 VISITOR  MAY VISIT WITH YOU AFTER SURGERY IN YOUR PRIVATE ROOM DURING VISITING HOURS ONLY!  YOU NEED TO HAVE A COVID 19 TEST ON__2/18_____ @_2 :00______, THIS TEST MUST BE DONE BEFORE SURGERY,  COVID TESTING SITE [address] [place] , IT IS ON THE RIGHT GOING OUT [address] APPROXIMATELY  2 MINUTES PAST [employer] ON THE RIGHT. ONCE YOUR COVID TEST IS COMPLETED,  PLEASE BEGIN THE QUARANTINE INSTRUCTIONS AS OUTLINED IN YOUR HANDOUT.                [address] [place]   Your procedure is scheduled on: 09/12/20   Report to [facility] Main  Entrance   Report to admitting at  10:00 AM     Call this number if you have problems the morning of surgery [phone]    Remember: Do not eat food or drink liquids :After Midnight  . BRUSH YOUR TEETH MORNING OF SURGERY AND RINSE YOUR MOUTH OUT, NO CHEWING GUM CANDY OR MINTS.     Take these medicines the morning of surgery with A SIP OF WATER: Labetelol                                 You may not have any metal on your body including hair pins and              piercings  Do not wear jewelry, make-up, lotions, powders or perfumes, deodorant             Do not wear nail polish on your fingernails.  Do not shave  48 hours prior to surgery.            Do not bring valuables to the hospital. [place] IS NOT             RESPONSIBLE   FOR VALUABLES.  Contacts, dentures or bridgework may not be worn into surgery.       Patients discharged the day of surgery will not be allowed to drive home  . IF YOU ARE HAVING SURGERY AND GOING HOME THE SAME DAY, YOU MUST HAVE AN ADULT TO DRIVE YOU HOME AND BE WITH YOU FOR 24 HOURS.  YOU MAY GO HOME BY TAXI OR UBER OR ORTHERWISE, BUT AN ADULT MUST ACCOMPANY YOU HOME AND STAY WITH YOU FOR 24 HOURS.  Name and phone number of your  driver:  Special Instructions: N/A              Please read over the following fact sheets you were given: _____________________________________________________________________             [facility] - Preparing for Surgery Before surgery, you can play an important role.  Because skin is not sterile, your skin needs to be as free of germs as possible.  You can reduce the number of germs on your skin by washing with CHG (chlorahexidine gluconate) soap before surgery.  CHG is an antiseptic cleaner which kills germs and bonds with the skin to continue killing germs even after washing. Please DO NOT use if you have an allergy to CHG or antibacterial soaps.  If your skin becomes reddened/irritated stop using the CHG and inform your nurse when you arrive at Short Stay. Do  not shave (including legs and underarms) for at least 48 hours prior to the first CHG shower.   Please follow these instructions carefully:  1.  Shower with CHG Soap the night before surgery and the  morning of Surgery.  2.  If you choose to wash your hair, wash your hair first as usual with your  normal  shampoo.  3.  After you shampoo, rinse your hair and body thoroughly to remove the  shampoo.                                        4.  Use CHG as you would any other liquid soap.  You can apply chg directly  to the skin and wash                       Gently with a scrungie or clean washcloth.  5.  Apply the CHG Soap to your body ONLY FROM THE NECK DOWN.   Do not use on face/ open                           Wound or open sores. Avoid contact with eyes, ears mouth and genitals (private parts).                       Wash face,  Genitals (private parts) with your normal soap.             6.  Wash thoroughly, paying special attention to the area where your surgery  will be performed.  7.  Thoroughly rinse your body with warm water from the neck down.  8.  DO NOT shower/wash with your normal soap after using and rinsing off  the CHG  Soap.             9.  Pat yourself dry with a clean towel.            10.  Wear clean pajamas.            11.  Place clean sheets on your bed the night of your first shower and do not  sleep with pets. Day of Surgery : Do not apply any lotions/deodorants the morning of surgery.  Please wear clean clothes to the hospital/surgery center.  FAILURE TO FOLLOW THESE INSTRUCTIONS MAY RESULT IN THE CANCELLATION OF YOUR SURGERY PATIENT SIGNATURE_________________________________  NURSE SIGNATURE__________________________________  ________________________________________________________________________

## 2020-09-01 ENCOUNTER — Encounter (HOSPITAL_COMMUNITY): Payer: Self-pay

## 2020-09-01 ENCOUNTER — Encounter (HOSPITAL_COMMUNITY)
Admission: RE | Admit: 2020-09-01 | Discharge: 2020-09-01 | Disposition: A | Discharge status: Home / Self Care | Payer: 59 | Source: Ambulatory Visit | Attending: Urology | Admitting: Urology

## 2020-09-01 ENCOUNTER — Other Ambulatory Visit: Payer: Self-pay

## 2020-09-01 DIAGNOSIS — I10 Essential (primary) hypertension: Secondary | ICD-10-CM | POA: Insufficient documentation

## 2020-09-01 DIAGNOSIS — Z01818 Encounter for other preprocedural examination: Secondary | ICD-10-CM | POA: Insufficient documentation

## 2020-09-01 HISTORY — DX: Family history of other specified conditions: Z84.89

## 2020-09-01 LAB — CBC
HCT: 40.3 % (ref 36.0–46.0)
Hemoglobin: 13.3 g/dL (ref 12.0–15.0)
MCH: 29.9 pg (ref 26.0–34.0)
MCHC: 33 g/dL (ref 30.0–36.0)
MCV: 90.6 fL (ref 80.0–100.0)
Platelets: 415 10*3/uL — ABNORMAL HIGH (ref 150–400)
RBC: 4.45 MIL/uL (ref 3.87–5.11)
RDW: 12.5 % (ref 11.5–15.5)
WBC: 5.8 10*3/uL (ref 4.0–10.5)
nRBC: 0 % (ref 0.0–0.2)

## 2020-09-01 NOTE — Progress Notes (Signed)
COVID Vaccine Completed:Yes Date COVID Vaccine completed:11/05/19- Booster -05/31/20 COVID vaccine manufacturer:   Moderna     PCP - Porfirio Oar PA Cardiologist - no  Chest x-ray - no EKG - 09/01/20 -chart, epic Stress Test - no ECHO - no Cardiac Cath - no Pacemaker/ICD device last checked:NA  Sleep Study - no CPAP -   Fasting Blood Sugar - NA Checks Blood Sugar _____ times a day  Blood Thinner Instructions:NA Aspirin Instructions: Last Dose:  Anesthesia review:   Patient denies shortness of breath, fever, cough and chest pain at PAT appointment yes  Patient verbalized understanding of instructions that were given to them at the PAT appointment. Patient was also instructed that they will need to review over the PAT instructions again at home before surgery.yes Pt has no SOB climbing stairs, doing housework or with ADLs. She has lost weight since her daughter's birth. She is still breast feeding.

## 2020-09-08 ENCOUNTER — Other Ambulatory Visit (HOSPITAL_COMMUNITY)
Admission: RE | Admit: 2020-09-08 | Discharge: 2020-09-08 | Disposition: A | Discharge status: Home / Self Care | Payer: 59 | Source: Ambulatory Visit | Attending: Urology | Admitting: Urology

## 2020-09-08 DIAGNOSIS — Z20822 Contact with and (suspected) exposure to covid-19: Secondary | ICD-10-CM | POA: Diagnosis not present

## 2020-09-08 DIAGNOSIS — Z01812 Encounter for preprocedural laboratory examination: Secondary | ICD-10-CM | POA: Diagnosis not present

## 2020-09-09 LAB — SARS CORONAVIRUS 2 (TAT 6-24 HRS): SARS Coronavirus 2: NEGATIVE

## 2020-09-12 SURGERY — Surgical Case
Anesthesia: *Unknown

## 2020-09-12 NOTE — Anesthesia Preprocedure Evaluation (Addendum)
Anesthesia Evaluation  Patient identified by MRN, date of birth, ID band Patient awake    Reviewed: Allergy & Precautions, NPO status , Patient's Chart, lab work & pertinent test results, reviewed documented beta blocker date and time   History of Anesthesia Complications Negative for: history of anesthetic complications  Airway Mallampati: I  TM Distance: >3 FB Neck ROM: Full    Dental no notable dental hx.    Pulmonary neg pulmonary ROS,    Pulmonary exam normal        Cardiovascular hypertension, Pt. on medications and Pt. on home beta blockers Normal cardiovascular exam     Neuro/Psych  Headaches, PSYCHIATRIC DISORDERS Anxiety    GI/Hepatic negative GI ROS, Neg liver ROS,   Endo/Other  BMI 39.9  Renal/GU negative Renal ROS     Musculoskeletal negative musculoskeletal ROS (+)   Abdominal   Peds  Hematology negative hematology ROS (+)   Anesthesia Other Findings   Reproductive/Obstetrics (+) Breast feeding                             Anesthesia Physical Anesthesia Plan  ASA: II  Anesthesia Plan: General   Post-op Pain Management:    Induction: Intravenous  PONV Risk Score and Plan: 4 or greater and Ondansetron, Dexamethasone, Midazolam and Scopolamine patch - Pre-op  Airway Management Planned: Oral ETT  Additional Equipment: None  Intra-op Plan:   Post-operative Plan: Extubation in OR  Informed Consent: I have reviewed the patients History and Physical, chart, labs and discussed the procedure including the risks, benefits and alternatives for the proposed anesthesia with the patient or authorized representative who has indicated his/her understanding and acceptance.     Dental advisory given  Plan Discussed with: Anesthesiologist and CRNA  Anesthesia Plan Comments:        Anesthesia Quick Evaluation

## 2020-09-14 ENCOUNTER — Other Ambulatory Visit (HOSPITAL_COMMUNITY): Payer: 59

## 2020-09-25 NOTE — Patient Instructions (Addendum)
DUE TO COVID-19 ONLY ONE VISITOR IS ALLOWED TO COME WITH YOU AND STAY IN THE WAITING ROOM ONLY DURING PRE OP AND PROCEDURE DAY OF SURGERY. THE 1 VISITOR  MAY VISIT WITH YOU AFTER SURGERY IN YOUR PRIVATE ROOM DURING VISITING HOURS ONLY!  YOU NEED TO HAVE A COVID 19 TEST ON_3/11______ @_10 :30______, THIS TEST MUST BE DONE BEFORE SURGERY,  COVID TESTING SITE [address] [place] , IT IS ON THE RIGHT GOING OUT [address] APPROXIMATELY  2 MINUTES PAST [employer] ON THE RIGHT. ONCE YOUR COVID TEST IS COMPLETED,  PLEASE BEGIN THE QUARANTINE INSTRUCTIONS AS OUTLINED IN YOUR HANDOUT.                [id number] [address]    Your procedure is scheduled on: 10/03/20   Report to [facility] Main  Entrance   Report to admitting at   10:00 AM     Call this number if you have problems the morning of surgery [phone]    Remember: Do not eat food or drink liquids :After Midnight  . BRUSH YOUR TEETH MORNING OF SURGERY AND RINSE YOUR MOUTH OUT, NO CHEWING GUM CANDY OR MINTS.     Take these medicines the morning of surgery with A SIP OF WATER: Labetalol                                You may not have any metal on your body including hair pins and              piercings  Do not wear jewelry, make-up, lotions, powders or perfumes, deodorant             Do not wear nail polish on your fingernails.  Do not shave  48 hours prior to surgery.             .   Do not bring valuables to the hospital. [place] IS NOT             RESPONSIBLE   FOR VALUABLES.  Contacts, dentures or bridgework may not be worn into surgery.                  Please read over the following fact sheets you were given: _____________________________________________________________________             [facility] - Preparing for Surgery Before surgery, you can play an important role.  Because skin is not sterile, your skin needs to be as free of germs as possible.  You can reduce the  number of germs on your skin by washing with CHG (chlorahexidine gluconate) soap before surgery.  CHG is an antiseptic cleaner which kills germs and bonds with the skin to continue killing germs even after washing. Please DO NOT use if you have an allergy to CHG or antibacterial soaps.  If your skin becomes reddened/irritated stop using the CHG and inform your nurse when you arrive at Short Stay. Do not shave (including legs and underarms) for at least 48 hours prior to the first CHG shower.   Please follow these instructions carefully:  1.  Shower with CHG Soap the night before surgery and the  morning of Surgery.  2.  If you choose to wash your hair, wash your hair first as usual with your  normal  shampoo.  3.  After you shampoo, rinse your hair and body thoroughly to  remove the  shampoo.                                        4.  Use CHG as you would any other liquid soap.  You can apply chg directly  to the skin and wash                       Gently with a scrungie or clean washcloth.  5.  Apply the CHG Soap to your body ONLY FROM THE NECK DOWN.   Do not use on face/ open                           Wound or open sores. Avoid contact with eyes, ears mouth and genitals (private parts).                       Wash face,  Genitals (private parts) with your normal soap.             6.  Wash thoroughly, paying special attention to the area where your surgery  will be performed.  7.  Thoroughly rinse your body with warm water from the neck down.  8.  DO NOT shower/wash with your normal soap after using and rinsing off  the CHG Soap.             9.  Pat yourself dry with a clean towel.            10.  Wear clean pajamas.            11.  Place clean sheets on your bed the night of your first shower and do not  sleep with pets. Day of Surgery : Do not apply any lotions/deodorants the morning of surgery.  Please wear clean clothes to the hospital/surgery center.  FAILURE TO FOLLOW THESE INSTRUCTIONS MAY  RESULT IN THE CANCELLATION OF YOUR SURGERY PATIENT SIGNATURE_________________________________  NURSE SIGNATURE__________________________________  ________________________________________________________________________

## 2020-09-26 ENCOUNTER — Encounter (HOSPITAL_COMMUNITY)
Admission: RE | Admit: 2020-09-26 | Discharge: 2020-09-26 | Disposition: A | Discharge status: Home / Self Care | Payer: 59 | Source: Ambulatory Visit | Attending: Urology | Admitting: Urology

## 2020-09-26 ENCOUNTER — Other Ambulatory Visit: Payer: Self-pay

## 2020-09-26 ENCOUNTER — Encounter (HOSPITAL_COMMUNITY): Payer: Self-pay

## 2020-09-26 DIAGNOSIS — Z01812 Encounter for preprocedural laboratory examination: Secondary | ICD-10-CM | POA: Diagnosis present

## 2020-09-26 LAB — CBC
HCT: 38.6 % (ref 36.0–46.0)
Hemoglobin: 13 g/dL (ref 12.0–15.0)
MCH: 30.7 pg (ref 26.0–34.0)
MCHC: 33.7 g/dL (ref 30.0–36.0)
MCV: 91.3 fL (ref 80.0–100.0)
Platelets: 401 10*3/uL — ABNORMAL HIGH (ref 150–400)
RBC: 4.23 MIL/uL (ref 3.87–5.11)
RDW: 12.4 % (ref 11.5–15.5)
WBC: 6.6 10*3/uL (ref 4.0–10.5)
nRBC: 0 % (ref 0.0–0.2)

## 2020-09-26 NOTE — Progress Notes (Addendum)
COVID Vaccine Completed:Yes Date COVID Vaccine completed:11/15/19-booster 05/31/20 COVID vaccine manufacturer:  Moderna     PCP - Dr. Audrie Gallus Cardiologist - none  Chest x-ray - no EKG - 09/01/20-epic Stress Test -no  ECHO - no Cardiac Cath - no Pacemaker/ICD device last checked:NA  Sleep Study - no CPAP -   Fasting Blood Sugar - NA Checks Blood Sugar _____ times a day  Blood Thinner Instructions:NA Aspirin Instructions: Last Dose:  Anesthesia review:   Patient denies shortness of breath, fever, cough and chest pain at PAT appointment yes  Patient verbalized understanding of instructions that were given to them at the PAT appointment. Patient was also instructed that they will need to review over the PAT instructions again at home before surgery. Yes Pt has no SOB with any activities. Pt is breast feeding.

## 2020-09-29 ENCOUNTER — Other Ambulatory Visit (HOSPITAL_COMMUNITY)
Admission: RE | Admit: 2020-09-29 | Discharge: 2020-09-29 | Disposition: A | Discharge status: Home / Self Care | Payer: 59 | Source: Ambulatory Visit | Attending: Urology | Admitting: Urology

## 2020-09-29 DIAGNOSIS — Z20822 Contact with and (suspected) exposure to covid-19: Secondary | ICD-10-CM | POA: Diagnosis not present

## 2020-09-29 DIAGNOSIS — Z01812 Encounter for preprocedural laboratory examination: Secondary | ICD-10-CM | POA: Insufficient documentation

## 2020-09-29 LAB — SARS CORONAVIRUS 2 (TAT 6-24 HRS): SARS Coronavirus 2: NEGATIVE

## 2020-10-03 ENCOUNTER — Ambulatory Visit (HOSPITAL_COMMUNITY): Payer: 59 | Admitting: Certified Registered Nurse Anesthetist

## 2020-10-03 ENCOUNTER — Encounter (HOSPITAL_COMMUNITY)
Admission: RE | Disposition: A | Discharge status: Home / Self Care | Payer: Self-pay | Source: Ambulatory Visit | Attending: Urology

## 2020-10-03 ENCOUNTER — Encounter (HOSPITAL_COMMUNITY): Payer: Self-pay | Admitting: Urology

## 2020-10-03 ENCOUNTER — Observation Stay (HOSPITAL_COMMUNITY)
Admission: RE | Admit: 2020-10-03 | Discharge: 2020-10-06 | Disposition: A | Discharge status: Home / Self Care | Payer: 59 | Source: Ambulatory Visit | Attending: Urology | Admitting: Urology

## 2020-10-03 DIAGNOSIS — N281 Cyst of kidney, acquired: Principal | ICD-10-CM

## 2020-10-03 DIAGNOSIS — I10 Essential (primary) hypertension: Secondary | ICD-10-CM | POA: Insufficient documentation

## 2020-10-03 DIAGNOSIS — Z880 Allergy status to penicillin: Secondary | ICD-10-CM | POA: Insufficient documentation

## 2020-10-03 DIAGNOSIS — R6881 Early satiety: Secondary | ICD-10-CM | POA: Diagnosis not present

## 2020-10-03 DIAGNOSIS — R109 Unspecified abdominal pain: Secondary | ICD-10-CM | POA: Insufficient documentation

## 2020-10-03 HISTORY — PX: LAPAROSCOPIC NEPHRECTOMY: SHX1930

## 2020-10-03 LAB — TYPE AND SCREEN
ABO/RH(D): A POS
Antibody Screen: NEGATIVE

## 2020-10-03 LAB — PREGNANCY, URINE: Preg Test, Ur: NEGATIVE

## 2020-10-03 LAB — HEMOGLOBIN AND HEMATOCRIT, BLOOD
HCT: 36.7 % (ref 36.0–46.0)
Hemoglobin: 12 g/dL (ref 12.0–15.0)

## 2020-10-03 LAB — HIV ANTIBODY (ROUTINE TESTING W REFLEX): HIV Screen 4th Generation wRfx: NONREACTIVE

## 2020-10-03 SURGERY — NEPHRECTOMY, RADICAL, LAPAROSCOPIC, ADULT
Anesthesia: General | Laterality: Right

## 2020-10-03 MED ORDER — FENTANYL CITRATE (PF) 100 MCG/2ML IJ SOLN
INTRAMUSCULAR | Status: DC | PRN
  Administered 2020-10-03: 50 ug via INTRAVENOUS
  Administered 2020-10-03: 25 ug via INTRAVENOUS
  Administered 2020-10-03: 50 ug via INTRAVENOUS
  Administered 2020-10-03 (×2): 25 ug via INTRAVENOUS
  Administered 2020-10-03 (×3): 50 ug via INTRAVENOUS
  Administered 2020-10-03: 100 ug via INTRAVENOUS
  Administered 2020-10-03: 25 ug via INTRAVENOUS

## 2020-10-03 MED ORDER — LABETALOL HCL 200 MG PO TABS: 200.0000 mg | ORAL_TABLET | Freq: Two times a day (BID) | ORAL | Status: DC

## 2020-10-03 MED ORDER — MIDAZOLAM HCL 2 MG/2ML IJ SOLN
INTRAMUSCULAR | Status: AC
  Filled 2020-10-03: qty 2

## 2020-10-03 MED ORDER — AMISULPRIDE (ANTIEMETIC) 5 MG/2ML IV SOLN: 10.0000 mg | Freq: Once | INTRAVENOUS | Status: DC | PRN

## 2020-10-03 MED ORDER — ACETAMINOPHEN 500 MG PO TABS
1000.0000 mg | ORAL_TABLET | Freq: Four times a day (QID) | ORAL | Status: DC
  Administered 2020-10-03 – 2020-10-06 (×10): 1000 mg via ORAL
  Filled 2020-10-03 (×10): qty 2

## 2020-10-03 MED ORDER — SENNA 8.6 MG PO TABS: 1.0000 | ORAL_TABLET | Freq: Two times a day (BID) | ORAL | Status: DC

## 2020-10-03 MED ORDER — SODIUM CHLORIDE (PF) 0.9 % IJ SOLN
INTRAMUSCULAR | Status: AC
  Filled 2020-10-03: qty 20

## 2020-10-03 MED ORDER — SCOPOLAMINE 1 MG/3DAYS TD PT72
1.0000 | MEDICATED_PATCH | TRANSDERMAL | Status: DC
  Administered 2020-10-03: 1.5 mg via TRANSDERMAL
  Filled 2020-10-03: qty 1

## 2020-10-03 MED ORDER — PROPOFOL 10 MG/ML IV BOLUS
INTRAVENOUS | Status: AC
  Filled 2020-10-03: qty 20

## 2020-10-03 MED ORDER — CELECOXIB 200 MG PO CAPS
200.0000 mg | ORAL_CAPSULE | Freq: Once | ORAL | Status: AC
  Administered 2020-10-03: 200 mg via ORAL
  Filled 2020-10-03: qty 1

## 2020-10-03 MED ORDER — DOCUSATE SODIUM 100 MG PO CAPS
100.0000 mg | ORAL_CAPSULE | Freq: Two times a day (BID) | ORAL | Status: DC
  Administered 2020-10-03 – 2020-10-06 (×6): 100 mg via ORAL
  Filled 2020-10-03 (×6): qty 1

## 2020-10-03 MED ORDER — ROCURONIUM BROMIDE 10 MG/ML (PF) SYRINGE
PREFILLED_SYRINGE | INTRAVENOUS | Status: AC
  Filled 2020-10-03: qty 10

## 2020-10-03 MED ORDER — ONDANSETRON HCL 4 MG/2ML IJ SOLN
INTRAMUSCULAR | Status: AC
  Filled 2020-10-03: qty 2

## 2020-10-03 MED ORDER — PROPOFOL 10 MG/ML IV BOLUS
INTRAVENOUS | Status: DC | PRN
  Administered 2020-10-03: 200 mg via INTRAVENOUS

## 2020-10-03 MED ORDER — ACETAMINOPHEN 500 MG PO TABS
1000.0000 mg | ORAL_TABLET | Freq: Once | ORAL | Status: AC
  Administered 2020-10-03: 1000 mg via ORAL
  Filled 2020-10-03: qty 2

## 2020-10-03 MED ORDER — DEXAMETHASONE SODIUM PHOSPHATE 10 MG/ML IJ SOLN
INTRAMUSCULAR | Status: AC
  Filled 2020-10-03: qty 1

## 2020-10-03 MED ORDER — MIDAZOLAM HCL 5 MG/5ML IJ SOLN
INTRAMUSCULAR | Status: DC | PRN
  Administered 2020-10-03 (×2): 1 mg via INTRAVENOUS

## 2020-10-03 MED ORDER — SODIUM CHLORIDE (PF) 0.9 % IJ SOLN
INTRAMUSCULAR | Status: DC | PRN
  Administered 2020-10-03: 20 mL

## 2020-10-03 MED ORDER — PROCHLORPERAZINE EDISYLATE 10 MG/2ML IJ SOLN: 5.0000 mg | Freq: Four times a day (QID) | INTRAMUSCULAR | Status: DC | PRN

## 2020-10-03 MED ORDER — ROCURONIUM BROMIDE 100 MG/10ML IV SOLN
INTRAVENOUS | Status: DC | PRN
  Administered 2020-10-03: 10 mg via INTRAVENOUS
  Administered 2020-10-03: 30 mg via INTRAVENOUS
  Administered 2020-10-03: 10 mg via INTRAVENOUS
  Administered 2020-10-03: 60 mg via INTRAVENOUS

## 2020-10-03 MED ORDER — BUPIVACAINE-EPINEPHRINE (PF) 0.25% -1:200000 IJ SOLN
INTRAMUSCULAR | Status: AC
  Filled 2020-10-03: qty 30

## 2020-10-03 MED ORDER — SENNA 8.6 MG PO TABS
1.0000 | ORAL_TABLET | Freq: Two times a day (BID) | ORAL | Status: DC
  Administered 2020-10-03 – 2020-10-06 (×6): 8.6 mg via ORAL
  Filled 2020-10-03 (×6): qty 1

## 2020-10-03 MED ORDER — LIDOCAINE HCL (CARDIAC) PF 100 MG/5ML IV SOSY
PREFILLED_SYRINGE | INTRAVENOUS | Status: DC | PRN
  Administered 2020-10-03: 100 mg via INTRAVENOUS

## 2020-10-03 MED ORDER — ONDANSETRON 4 MG PO TBDP: 4.0000 mg | ORAL_TABLET | Freq: Four times a day (QID) | ORAL | Status: DC | PRN

## 2020-10-03 MED ORDER — ORAL CARE MOUTH RINSE: 15.0000 mL | Freq: Once | OROMUCOSAL | Status: AC

## 2020-10-03 MED ORDER — HYDROMORPHONE HCL 1 MG/ML IJ SOLN
INTRAMUSCULAR | Status: AC
  Administered 2020-10-03: 0.5 mg via INTRAVENOUS
  Filled 2020-10-03: qty 1

## 2020-10-03 MED ORDER — ONDANSETRON HCL 4 MG/2ML IJ SOLN
4.0000 mg | Freq: Four times a day (QID) | INTRAMUSCULAR | Status: DC | PRN
  Administered 2020-10-05: 4 mg via INTRAVENOUS
  Filled 2020-10-03: qty 2

## 2020-10-03 MED ORDER — LACTATED RINGERS IR SOLN
Status: DC | PRN
  Administered 2020-10-03: 1000 mL

## 2020-10-03 MED ORDER — BUPIVACAINE LIPOSOME 1.3 % IJ SUSP
20.0000 mL | Freq: Once | INTRAMUSCULAR | Status: AC
  Administered 2020-10-03: 20 mL
  Filled 2020-10-03: qty 20

## 2020-10-03 MED ORDER — ONDANSETRON HCL 4 MG/2ML IJ SOLN
INTRAMUSCULAR | Status: DC | PRN
  Administered 2020-10-03: 4 mg via INTRAVENOUS

## 2020-10-03 MED ORDER — LACTATED RINGERS IV SOLN: INTRAVENOUS | Status: DC

## 2020-10-03 MED ORDER — PHENYLEPHRINE HCL (PRESSORS) 10 MG/ML IV SOLN
INTRAVENOUS | Status: AC
  Filled 2020-10-03: qty 1

## 2020-10-03 MED ORDER — HYDROMORPHONE HCL 1 MG/ML IJ SOLN
0.5000 mg | Freq: Once | INTRAMUSCULAR | Status: AC
  Administered 2020-10-03: 0.5 mg via INTRAVENOUS

## 2020-10-03 MED ORDER — PROCHLORPERAZINE MALEATE 10 MG PO TABS: 10.0000 mg | ORAL_TABLET | Freq: Four times a day (QID) | ORAL | Status: DC | PRN

## 2020-10-03 MED ORDER — HYDROMORPHONE HCL 1 MG/ML IJ SOLN: 0.5000 mg | INTRAMUSCULAR | Status: AC | PRN

## 2020-10-03 MED ORDER — ONDANSETRON 4 MG PO TBDP
4.0000 mg | ORAL_TABLET | Freq: Four times a day (QID) | ORAL | Status: DC | PRN
  Administered 2020-10-04: 4 mg via ORAL
  Filled 2020-10-03: qty 1

## 2020-10-03 MED ORDER — FENTANYL CITRATE (PF) 100 MCG/2ML IJ SOLN
25.0000 ug | INTRAMUSCULAR | Status: DC | PRN
  Administered 2020-10-03 (×3): 25 ug via INTRAVENOUS

## 2020-10-03 MED ORDER — HYDROMORPHONE HCL 1 MG/ML IJ SOLN
0.5000 mg | INTRAMUSCULAR | Status: AC | PRN
  Administered 2020-10-03: 0.5 mg via INTRAVENOUS

## 2020-10-03 MED ORDER — ONDANSETRON HCL 4 MG/2ML IJ SOLN: 4.0000 mg | Freq: Four times a day (QID) | INTRAMUSCULAR | Status: DC | PRN

## 2020-10-03 MED ORDER — CHLORHEXIDINE GLUCONATE 0.12 % MT SOLN
15.0000 mL | Freq: Once | OROMUCOSAL | Status: AC
  Administered 2020-10-03: 15 mL via OROMUCOSAL

## 2020-10-03 MED ORDER — CLINDAMYCIN PHOSPHATE 900 MG/50ML IV SOLN
900.0000 mg | Freq: Once | INTRAVENOUS | Status: AC
  Administered 2020-10-03: 900 mg via INTRAVENOUS
  Filled 2020-10-03: qty 50

## 2020-10-03 MED ORDER — DOCUSATE SODIUM 100 MG PO CAPS: 100.0000 mg | ORAL_CAPSULE | Freq: Two times a day (BID) | ORAL | Status: DC

## 2020-10-03 MED ORDER — PHENYLEPHRINE 40 MCG/ML (10ML) SYRINGE FOR IV PUSH (FOR BLOOD PRESSURE SUPPORT)
PREFILLED_SYRINGE | INTRAVENOUS | Status: DC | PRN
  Administered 2020-10-03 (×2): 120 ug via INTRAVENOUS
  Administered 2020-10-03: 40 ug via INTRAVENOUS
  Administered 2020-10-03: 120 ug via INTRAVENOUS

## 2020-10-03 MED ORDER — FENTANYL CITRATE (PF) 100 MCG/2ML IJ SOLN
INTRAMUSCULAR | Status: AC
  Administered 2020-10-03: 25 ug via INTRAVENOUS
  Filled 2020-10-03: qty 2

## 2020-10-03 MED ORDER — PROCHLORPERAZINE MALEATE 10 MG PO TABS
10.0000 mg | ORAL_TABLET | Freq: Four times a day (QID) | ORAL | Status: DC | PRN
  Administered 2020-10-05: 10 mg via ORAL
  Filled 2020-10-03: qty 1

## 2020-10-03 MED ORDER — SUGAMMADEX SODIUM 200 MG/2ML IV SOLN
INTRAVENOUS | Status: DC | PRN
  Administered 2020-10-03: 200 mg via INTRAVENOUS

## 2020-10-03 MED ORDER — FENTANYL CITRATE (PF) 100 MCG/2ML IJ SOLN
INTRAMUSCULAR | Status: AC
  Filled 2020-10-03: qty 2

## 2020-10-03 MED ORDER — DEXAMETHASONE SODIUM PHOSPHATE 10 MG/ML IJ SOLN
INTRAMUSCULAR | Status: DC | PRN
  Administered 2020-10-03: 5 mg via INTRAVENOUS

## 2020-10-03 MED ORDER — HYDROMORPHONE HCL 1 MG/ML IJ SOLN: 0.5000 mg | INTRAMUSCULAR | Status: DC | PRN

## 2020-10-03 MED ORDER — LABETALOL HCL 200 MG PO TABS
200.0000 mg | ORAL_TABLET | Freq: Two times a day (BID) | ORAL | Status: DC
  Administered 2020-10-03 – 2020-10-06 (×6): 200 mg via ORAL
  Filled 2020-10-03 (×6): qty 1

## 2020-10-03 MED ORDER — FENTANYL CITRATE (PF) 250 MCG/5ML IJ SOLN
INTRAMUSCULAR | Status: AC
  Filled 2020-10-03: qty 5

## 2020-10-03 MED ORDER — DEXTROSE-NACL 5-0.45 % IV SOLN: INTRAVENOUS | Status: DC

## 2020-10-03 MED ORDER — OXYCODONE HCL 5 MG PO TABS: 5.0000 mg | ORAL_TABLET | ORAL | Status: DC | PRN

## 2020-10-03 MED ORDER — OXYCODONE HCL 5 MG PO TABS
5.0000 mg | ORAL_TABLET | ORAL | Status: DC | PRN
  Administered 2020-10-03 – 2020-10-05 (×5): 10 mg via ORAL
  Filled 2020-10-03 (×5): qty 2

## 2020-10-03 MED ORDER — ACETAMINOPHEN 500 MG PO TABS: 1000.0000 mg | ORAL_TABLET | Freq: Four times a day (QID) | ORAL | Status: DC

## 2020-10-03 MED ORDER — LIDOCAINE 2% (20 MG/ML) 5 ML SYRINGE
INTRAMUSCULAR | Status: AC
  Filled 2020-10-03: qty 5

## 2020-10-03 MED ORDER — HYDROMORPHONE HCL 1 MG/ML IJ SOLN
0.5000 mg | INTRAMUSCULAR | Status: DC | PRN
  Administered 2020-10-03 – 2020-10-05 (×4): 1 mg via INTRAVENOUS
  Filled 2020-10-03 (×4): qty 1

## 2020-10-03 SURGICAL SUPPLY — 61 items
ADH SKN CLS APL DERMABOND .7 (GAUZE/BANDAGES/DRESSINGS) ×1
APL LAPSCP 35 DL APL RGD (MISCELLANEOUS)
APL PRP STRL LF DISP 70% ISPRP (MISCELLANEOUS) ×2
APL SRG 32X5 SNPLK LF DISP (MISCELLANEOUS)
APPLICATOR VISTASEAL 35 (MISCELLANEOUS) ×1 IMPLANT
BAG SPEC RTRVL LRG 6X4 10 (ENDOMECHANICALS) ×1
CHLORAPREP W/TINT 26 (MISCELLANEOUS) ×3 IMPLANT
CLIP VESOLOCK LG 6/CT PURPLE (CLIP) ×3 IMPLANT
CLIP VESOLOCK MED LG 6/CT (CLIP) ×3 IMPLANT
CLIP VESOLOCK XL 6/CT (CLIP) ×1 IMPLANT
COVER SURGICAL LIGHT HANDLE (MISCELLANEOUS) ×2 IMPLANT
COVER WAND RF STERILE (DRAPES) IMPLANT
CUTTER ECHEON FLEX ENDO 45 340 (ENDOMECHANICALS) IMPLANT
DECANTER SPIKE VIAL GLASS SM (MISCELLANEOUS) ×3 IMPLANT
DERMABOND ADVANCED (GAUZE/BANDAGES/DRESSINGS) ×1
DERMABOND ADVANCED .7 DNX12 (GAUZE/BANDAGES/DRESSINGS) ×1 IMPLANT
DRAIN CHANNEL 15F RND FF 3/16 (WOUND CARE) IMPLANT
DRAPE INCISE IOBAN 66X45 STRL (DRAPES) ×2 IMPLANT
DRAPE SHEET LG 3/4 BI-LAMINATE (DRAPES) ×2 IMPLANT
ELECT PENCIL ROCKER SW 15FT (MISCELLANEOUS) ×2 IMPLANT
ELECT REM PT RETURN 15FT ADLT (MISCELLANEOUS) ×2 IMPLANT
EVACUATOR SILICONE 100CC (DRAIN) IMPLANT
GLOVE SRG 8 PF TXTR STRL LF DI (GLOVE) ×2 IMPLANT
GLOVE SURG ENC MOIS LTX SZ6.5 (GLOVE) ×2 IMPLANT
GLOVE SURG ENC TEXT LTX SZ7.5 (GLOVE) ×4 IMPLANT
GLOVE SURG UNDER POLY LF SZ8 (GLOVE) ×4
GOWN STRL REUS W/TWL LRG LVL3 (GOWN DISPOSABLE) ×3 IMPLANT
GOWN STRL REUS W/TWL XL LVL3 (GOWN DISPOSABLE) ×3 IMPLANT
HEMOSTAT SURGICEL 4X8 (HEMOSTASIS) ×2 IMPLANT
HOLDER FOLEY CATH W/STRAP (MISCELLANEOUS) ×2 IMPLANT
IRRIG SUCT STRYKERFLOW 2 WTIP (MISCELLANEOUS) ×2
IRRIGATION SUCT STRKRFLW 2 WTP (MISCELLANEOUS) ×1 IMPLANT
KIT BASIN OR (CUSTOM PROCEDURE TRAY) ×2 IMPLANT
KIT TURNOVER KIT A (KITS) ×2 IMPLANT
LIGASURE VESSEL 5MM BLUNT TIP (ELECTROSURGICAL) ×1 IMPLANT
LOOP VESSEL MAXI BLUE (MISCELLANEOUS) IMPLANT
MARKER SKIN DUAL TIP RULER LAB (MISCELLANEOUS) ×2 IMPLANT
NDL INSUFFLATION 14GA 120MM (NEEDLE) ×1 IMPLANT
NEEDLE INSUFFLATION 14GA 120MM (NEEDLE) ×2 IMPLANT
POUCH SPECIMEN RETRIEVAL 10MM (ENDOMECHANICALS) ×2 IMPLANT
PROTECTOR NERVE ULNAR (MISCELLANEOUS) ×4 IMPLANT
RELOAD STAPLE 45 2.6 WHT THIN (STAPLE) IMPLANT
SCISSORS LAP 5X35 DISP (ENDOMECHANICALS) ×1 IMPLANT
SEALANT SURGICAL APPL DUAL CAN (MISCELLANEOUS) IMPLANT
SET TUBE SMOKE EVAC HIGH FLOW (TUBING) ×2 IMPLANT
SLEEVE XCEL OPT CAN 5 100 (ENDOMECHANICALS) ×1 IMPLANT
STAPLE RELOAD 45 WHT (STAPLE) IMPLANT
STAPLE RELOAD 45MM WHITE (STAPLE)
SUT ETHILON 2 0 PSLX (SUTURE) IMPLANT
SUT MNCRL AB 4-0 PS2 18 (SUTURE) ×4 IMPLANT
SUT PDS AB 0 CT1 36 (SUTURE) IMPLANT
SUT VICRYL 0 UR6 27IN ABS (SUTURE) IMPLANT
SUT VLOC BARB 180 ABS3/0GR12 (SUTURE) ×2
SUTURE VLOC BRB 180 ABS3/0GR12 (SUTURE) ×2 IMPLANT
TOWEL OR 17X26 10 PK STRL BLUE (TOWEL DISPOSABLE) ×2 IMPLANT
TOWEL OR NON WOVEN STRL DISP B (DISPOSABLE) ×2 IMPLANT
TRAY FOLEY MTR SLVR 16FR STAT (SET/KITS/TRAYS/PACK) ×2 IMPLANT
TRAY LAPAROSCOPIC (CUSTOM PROCEDURE TRAY) ×2 IMPLANT
TROCAR BLADELESS OPT 5 100 (ENDOMECHANICALS) ×1 IMPLANT
TROCAR UNIVERSAL OPT 12M 100M (ENDOMECHANICALS) IMPLANT
TROCAR XCEL 12X100 BLDLESS (ENDOMECHANICALS) ×2 IMPLANT

## 2020-10-03 NOTE — Op Note (Signed)
Operative Note  Preoperative diagnosis:  1.  15 cm Bosniak 2 right renal cyst 2.  Right flank pain 3.  Early satiety  Postoperative diagnosis: 1.  15 cm Bosniak 2 right renal cyst 2.  Right flank pain 3.  Early satiety  Procedure(s): 1.  Laparoscopic right renal cyst decortication  Surgeon: Rhoderick Moody, MD  Assistants: Margette Fast, MD PGY 4  Anesthesia:  General  Complications:  None  EBL: 400 mL  Specimens: 1.  Right renal cyst  Drains/Catheters: 1.  Foley catheter  Intraoperative findings:   1. Right renal cyst drained approximately 600 mL of clear fluid  Indication:  Kimberly Glass is a 35 y.o. female with ongoing right-sided flank pain and early satiety due to a 15 cm Bosniak 2 right renal cyst.  She has been consented for the above procedures, voices understanding wishes to proceed.  Description of procedure:  After informed consent was obtained, the patient was brought to the operating room and general endotracheal anesthesia was administered.  The patient was placed in the left lateral decubitus position and prepped and draped in usual sterile fashion.  A timeout was then performed.  A 12 mm incision was then made lateral to the right rectus muscle at the level of the right 12th rib.  A Veress needle was then used to insufflate the abdomen to 15 mmHg.  A 12 mm laparoscopic port was then atraumatically inserted into the abdominal cavity.  Full inspection of the abdominal cavity following port access revealed no evidence of adjacent organ or vessel injury.  We then placed 1 additional 12 mm port as well as a 5 mm laparoscopic port to triangulate the right renal hilum.    The white line of Toldt of the ascending colon was then incised and the colon, with its mesocolonic fat, was reflected medially.  The medial reflexion of the colon gave [place] access to the right retroperitoneum.  The large right renal cyst protruding from the anterior surface of the kidney was  immediately identified.  The surrounding investing tissue was then separated from the exterior surface of the cyst until it was fully mobilized.  Laparoscopic scissors were then used to incise the anterior surface of the renal cyst, producing approximately 600 mL of clear fluid that was immediately suctioned from the lumen of the cyst.  A LigaSure was then used to excise the freely mobile portion of the right renal cyst.  The portion of the cyst that was invested to the renal parenchyma was left intact.  There was a small renal sinus vein that required multiple Hem-o-lok clips, but was ultimately hemostatic at the conclusion of the case.  The Gerota's fascia and perinephric fat were then reapproximated with attachments anchored at the cut edge of the cyst cavity.  The cyst wall was then placed in an Endo Catch bag.  The abdomen was then desufflated and the specimen was removed through the camera port.  The 12 mm camera port was then closed using a [employer] technique using a 0 Vicryl suture.  The skin incisions were then closed with 4-0 Monocryl in a running fashion and dressed with Dermabond.  The patient was awoken from anesthesia, having tolerated the procedure well.  She was transferred to the postanesthesia in stable condition.  Plan: Monitor on the floor overnight

## 2020-10-03 NOTE — H&P (Signed)
Urology Preoperative H&P   Chief Complaint: Right flank pain  History of Present Illness: Kimberly Glass is a 35 y.o. female with a Bosniak 2, 15 x 12 cm right renal cyst that was initially discovered during a pelvic/abdominal ultrasound and further characterized on CT.   She reports on-going right flank pain and recently started having left sided discomfort and continues to experience early satiety. Denies N/V, dysuria or hematuria.   Past Medical History:  Diagnosis Date  . Acne vulgaris   . Allergic rhinitis, cause unspecified   . Anxiety   . Endometriosis dx Jan 2014  . Erythema nodosum    associated with menses; improved with OCP  . Essential hypertension, benign   . Family history of adverse reaction to anesthesia    mother has N&V  . H/O pilonidal cyst   . Headache, migraine    rare  . Obesity, unspecified     Past Surgical History:  Procedure Laterality Date  . OPEN REDUCTION INTERNAL FIXATION (ORIF) METACARPAL Right 10/28/2014   Procedure: OPEN REDUCTION INTERNAL FIXATION RIGHT FINGER METACARPAL FRACTURE;  Surgeon: Dairl Ponder, MD;  Location: [facility] OR;  Service: Orthopedics;  Laterality: Right;  . ORIF RADIAL FRACTURE Right 10/28/2014   Procedure: OPEN REDUCTION INTERNAL FIXATION RIGHT RADIUS FRACTURE;  Surgeon: Dairl Ponder, MD;  Location: [facility] OR;  Service: Orthopedics;  Laterality: Right;  . ORIF SCAPHOID FRACTURE Right 10/28/2014   Procedure: OPEN REDUCTION INTERNAL FIXATION (ORIF) SCAPHOID FRACTURE;  Surgeon: Dairl Ponder, MD;  Location: [facility] OR;  Service: Orthopedics;  Laterality: Right;  . PILONIDAL CYST EXCISION  01/30/2015   Ardeth Sportsman, MD  . WISDOM TOOTH EXTRACTION      Allergies:  Allergies  Allergen Reactions  . Diphenhydramine Other (See Comments)    Hyperactivity  . Topiramate Diarrhea  . Yasmin [Drospirenone-Ethinyl Estradiol] Other (See Comments)    Migraine Headache  . Penicillins Hives    Family History  Problem Relation Age of Onset   . Heart disease Father 80       AMI; stents  . Hypertension Father   . Hyperlipidemia Father   . Diabetes Maternal Grandmother   . Endometriosis Maternal Grandmother   . Cancer Mother        kidney  . Endometriosis Mother   . Cancer Maternal Grandfather   . Stroke Paternal Grandfather   . Hypertension Paternal Grandfather   . Hyperlipidemia Paternal Grandfather   . Heart disease Paternal Grandfather     Social History:  reports that she has never smoked. She has never used smokeless tobacco. She reports current alcohol use of about 1.0 standard drink of alcohol per week. She reports that she does not use drugs.  ROS: A complete review of systems was performed.  All systems are negative except for pertinent findings as noted.  Physical Exam:  Vital signs in last 24 hours: Temp:  [98 F (36.7 C)] 98 F (36.7 C) (03/15 1020) Pulse Rate:  [89] 89 (03/15 1020) Resp:  [18] 18 (03/15 1020) BP: (150)/(90) 150/90 (03/15 1020) SpO2:  [96 %] 96 % (03/15 1020) Weight:  [122.5 kg] 122.5 kg (03/15 1035) Constitutional:  Alert and oriented, No acute distress Cardiovascular: Regular rate and rhythm, No JVD Respiratory: Normal respiratory effort, Lungs clear bilaterally GI: Abdomen is soft, nontender, nondistended, no abdominal masses GU: No CVA tenderness Lymphatic: No lymphadenopathy Neurologic: Grossly intact, no focal deficits Psychiatric: Normal mood and affect  Laboratory Data:  No results for input(s): WBC, HGB, HCT, PLT in  the last 72 hours.  No results for input(s): NA, K, CL, GLUCOSE, BUN, CALCIUM, CREATININE in the last 72 hours.  Invalid input(s): CO3   Results for orders placed or performed during the hospital encounter of 10/03/20 (from the past 24 hour(s))  Pregnancy, urine per protocol     Status: None   Collection Time: 10/03/20 10:22 AM  Result Value Ref Range   Preg Test, Ur NEGATIVE NEGATIVE   Recent Results (from the past 240 hour(s))  SARS CORONAVIRUS 2  (TAT 6-24 HRS) Nasopharyngeal Nasopharyngeal Swab     Status: None   Collection Time: 09/29/20 11:11 AM   Specimen: Nasopharyngeal Swab  Result Value Ref Range Status   SARS Coronavirus 2 NEGATIVE NEGATIVE Final    Comment: (NOTE) SARS-CoV-2 target nucleic acids are NOT DETECTED.  The SARS-CoV-2 RNA is generally detectable in upper and lower respiratory specimens during the acute phase of infection. Negative results do not preclude SARS-CoV-2 infection, do not rule out co-infections with other pathogens, and should not be used as the sole basis for treatment or other patient management decisions. Negative results must be combined with clinical observations, patient history, and epidemiological information. The expected result is Negative.  Fact Sheet for Patients: HairSlick.no  Fact Sheet for Healthcare Providers: quierodirigir.com  This test is not yet approved or cleared by the [place] FDA and  has been authorized for detection and/or diagnosis of SARS-CoV-2 by FDA under an Emergency Use Authorization (EUA). This EUA will remain  in effect (meaning this test can be used) for the duration of the COVID-19 declaration under Se ction 564(b)(1) of the Act, 21 U.S.C. section 360bbb-3(b)(1), unless the authorization is terminated or revoked sooner.  Performed at [facility], [address], [place], [place] [address]     Renal Function: No results for input(s): CREATININE in the last 168 hours. CrCl cannot be calculated (Patient's most recent lab result is older than the maximum 21 days allowed.).  Radiologic Imaging: CLINICAL DATA:  Right-sided abdominal pain.  Creatinine was obtained on site at [facility] at [address]  Results: Creatinine 0.7 mg/dL.  EXAM: CT ABDOMEN AND PELVIS WITH CONTRAST  TECHNIQUE: Multidetector CT imaging of the abdomen and pelvis was performed using the  standard protocol following bolus administration of intravenous contrast.  CONTRAST:  ISOVUE-300 IOPAMIDOL (ISOVUE-300) INJECTION 61%  COMPARISON:  None.  FINDINGS: Lower Chest: No acute findings.  Hepatobiliary: No hepatic masses identified. Gallbladder is unremarkable. No evidence of biliary ductal dilatation.  Pancreas:  No mass or inflammatory changes.  Spleen: Within normal limits in size and appearance.  Adrenals/Urinary Tract: A large cystic lesion is seen in the right kidney measuring 15.0 x 12.6 cm. This lesion shows a single thin septation without enhancement and mild mural calcification, but no other complex features. This is consistent with a benign Bosniak category 2 cyst. The left kidney and adrenal glands are normal in appearance. No evidence of hydronephrosis.  Stomach/Bowel: No evidence of obstruction, inflammatory process or abnormal fluid collections. Normal appendix visualized.  Vascular/Lymphatic: No pathologically enlarged lymph nodes. No abdominal aortic aneurysm.  Reproductive: IUD is seen in appropriate position. No mass or other significant abnormality.  Other:  None.  Musculoskeletal:  No suspicious bone lesions identified.  IMPRESSION: 1. 15 cm benign Bosniak category 2 cyst in right kidney. 2. Otherwise unremarkable exam.   Electronically Signed   By: Danae Orleans M.D.   On: 07/13/2019 10:01 I independently reviewed the above imaging  studies.  Assessment and Plan Martita Brumm Knoblock is a 35 y.o. female with a 12x15 cm Bosniak 2 right renal cyst causing right flank pain and early satiety   -The risks, benefits and alternatives of laparoscopic right cyst decortication was discussed with the patient. Risks included, but are not limited to bleeding, infection, renal cyst recurrence (~10-15%), injury to adjacent organs, open conversion along with the inherent risks of general anesthesia such MI, CVA, PE and DVT. The patient  voices understanding and wishes to proceed.     Rhoderick Moody, MD 10/03/2020, 11:05 AM  [facility] Pager: [phone]

## 2020-10-03 NOTE — Anesthesia Procedure Notes (Signed)
Procedure Name: Intubation Date/Time: 10/03/2020 1:36 PM Performed by: Garrel Ridgel, CRNA Pre-anesthesia Checklist: Patient identified, Emergency Drugs available, Suction available and Patient being monitored Patient Re-evaluated:Patient Re-evaluated prior to induction Oxygen Delivery Method: Circle system utilized Preoxygenation: Pre-oxygenation with 100% oxygen Induction Type: IV induction Ventilation: Mask ventilation without difficulty Laryngoscope Size: Mac and 3 Grade View: Grade I Tube type: Oral Tube size: 7.0 mm Number of attempts: 1 Airway Equipment and Method: Stylet and Oral airway Placement Confirmation: ETT inserted through vocal cords under direct vision,  positive ETCO2 and breath sounds checked- equal and bilateral Secured at: 21 cm Dental Injury: Teeth and Oropharynx as per pre-operative assessment

## 2020-10-03 NOTE — Anesthesia Postprocedure Evaluation (Signed)
Anesthesia Post Note  Patient: Kimberly Glass  Procedure(s) Performed: LAPAROSCOPIC RENAL CYST DECORTICATION (Right )     Patient location during evaluation: PACU Anesthesia Type: General Level of consciousness: awake and alert and oriented Pain management: pain level controlled Vital Signs Assessment: post-procedure vital signs reviewed and stable Respiratory status: spontaneous breathing, nonlabored ventilation and respiratory function stable Cardiovascular status: blood pressure returned to baseline Postop Assessment: no apparent nausea or vomiting Anesthetic complications: no   No complications documented.  Last Vitals:  Vitals:   10/03/20 1900 10/03/20 2052  BP: 137/84 136/86  Pulse: 78 67  Resp: 18 16  Temp: 36.7 C 37.1 C  SpO2: 100% 100%    Last Pain:  Vitals:   10/03/20 2052  TempSrc: Oral  PainSc:                  Kaylyn Layer

## 2020-10-03 NOTE — Transfer of Care (Signed)
Immediate Anesthesia Transfer of Care Note  Patient: Kimberly Glass  Procedure(s) Performed: LAPAROSCOPIC RENAL CYST DECORTICATION (Right )  Patient Location: PACU  Anesthesia Type:General  Level of Consciousness: drowsy and patient cooperative  Airway & Oxygen Therapy: Patient Spontanous Breathing and Patient connected to face mask oxygen  Post-op Assessment: Report given to RN and Post -op Vital signs reviewed and stable  Post vital signs: Reviewed and stable  Last Vitals:  Vitals Value Taken Time  BP 149/93 10/03/20 1550  Temp    Pulse 84 10/03/20 1554  Resp 12 10/03/20 1554  SpO2 100 % 10/03/20 1554  Vitals shown include unvalidated device data.  Last Pain:  Vitals:   10/03/20 1035  TempSrc:   PainSc: 0-No pain         Complications: No complications documented.

## 2020-10-04 ENCOUNTER — Encounter (HOSPITAL_COMMUNITY): Payer: Self-pay | Admitting: Urology

## 2020-10-04 ENCOUNTER — Other Ambulatory Visit: Payer: Self-pay

## 2020-10-04 DIAGNOSIS — N281 Cyst of kidney, acquired: Secondary | ICD-10-CM | POA: Diagnosis not present

## 2020-10-04 LAB — BASIC METABOLIC PANEL
Anion gap: 10 (ref 5–15)
BUN: 11 mg/dL (ref 6–20)
CO2: 25 mmol/L (ref 22–32)
Calcium: 9.2 mg/dL (ref 8.9–10.3)
Chloride: 105 mmol/L (ref 98–111)
Creatinine, Ser: 0.72 mg/dL (ref 0.44–1.00)
GFR, Estimated: >60 mL/min (ref >60)
Glucose, Bld: 111 mg/dL — ABNORMAL HIGH (ref 70–99)
Potassium: 4.7 mmol/L (ref 3.5–5.1)
Sodium: 140 mmol/L (ref 135–145)

## 2020-10-04 LAB — HEMOGLOBIN AND HEMATOCRIT, BLOOD
HCT: 33.4 % — ABNORMAL LOW (ref 36.0–46.0)
Hemoglobin: 11 g/dL — ABNORMAL LOW (ref 12.0–15.0)

## 2020-10-04 MED ORDER — HYDROCODONE-ACETAMINOPHEN 5-325 MG PO TABS: 1.0000 | ORAL_TABLET | ORAL | 0 refills | Status: DC | PRN

## 2020-10-04 MED ORDER — LIDOCAINE 5 % EX PTCH
2.0000 | MEDICATED_PATCH | CUTANEOUS | Status: DC
  Administered 2020-10-04 – 2020-10-06 (×3): 2 via TRANSDERMAL
  Filled 2020-10-04 (×3): qty 2

## 2020-10-04 MED ORDER — CHLORHEXIDINE GLUCONATE CLOTH 2 % EX PADS
6.0000 | MEDICATED_PAD | Freq: Every day | CUTANEOUS | Status: DC
  Administered 2020-10-04: 6 via TOPICAL

## 2020-10-04 MED ORDER — ONDANSETRON HCL 4 MG PO TABS: 4.0000 mg | ORAL_TABLET | Freq: Every day | ORAL | 1 refills | Status: DC | PRN

## 2020-10-04 NOTE — Progress Notes (Signed)
1 Day Post-Op Subjective: NAEON. AFHDS on 2L [place] sating >99% O2. Using IS, pulling ~750cc. Reports incisional pain that is controlled with analgesics. Tolerating CLD without nausea/emesis. Hasn't ambulated. No flatus.   Cr normal. Hgb slightly downtrended to 11 from 12 postop, 13 preop.   Objective: Vital signs in last 24 hours: Temp:  [98 F (36.7 C)-98.7 F (37.1 C)] 98.2 F (36.8 C) (03/16 0409) Pulse Rate:  [60-95] 68 (03/16 0409) Resp:  [12-23] 18 (03/16 0409) BP: (127-150)/(67-93) 131/85 (03/16 0409) SpO2:  [94 %-100 %] 99 % (03/16 0409) Weight:  [122.5 kg] 122.5 kg (03/16 0249)  Intake/Output from previous day: 03/15 0701 - 03/16 0700 In: 3200 [P.O.:500; I.V.:2700] Out: 2250 [Urine:1250; Blood:400] Intake/Output this shift: No intake/output data recorded.  Physical Exam:  General: Laying in bed in NAD CV: Regular rate Lungs: NWOB on 2L [place] Abdomen: Soft, appropriately tender, nondistended. Incisions intact, covered with dermabond, no signs of infection  GU: Foley draining clear yellow urine  Ext: Warm and well-perfused  Lab Results: Recent Labs    10/03/20 1603 10/04/20 0417  HGB 12.0 11.0*  HCT 36.7 33.4*   BMET Recent Labs    10/04/20 0417  NA 140  K 4.7  CL 105  CO2 25  GLUCOSE 111*  BUN 11  CREATININE 0.72  CALCIUM 9.2     Studies/Results: No results found.  Assessment/Plan: Kimberly Glass is a 35 y.o. female with a Bosniak 2, 15 x 12 cm right renal cyst that was initially discovered during a pelvic/abdominal ultrasound and further characterized on CT associated with on-going right flank pain and early satiety. She is now POD1 s/p laparoscopic right renal cyst decortication. She is overall doing well.  - Advanced to regular diet. Stop IVF. Bowel regimen - Remove foley/TOV - Add lidocaine patches. Continue SCH tylenol; PRN oxy, dilaudid - PRN anti-emetics - Wean O2, encourage IS - Encourage ambulation, SCDs - Continue home labetalol  -  EDD later today versus tomorrow    LOS: 0 days   Margette Fast 10/04/2020, 7:47 AM

## 2020-10-04 NOTE — Discharge Summary (Incomplete)
Date of admission: 10/03/2020  Date of discharge: 10/05/2020  Admission diagnosis: Right renal cyst  Discharge diagnosis: Right renal cyst  Secondary diagnoses: None  History and Physical: For full details, please see admission history and physical. Briefly, Kimberly Glass is a 35 y.o. femalewith a Bosniak 2,15 x 12 cm right renal cyst that was initially discovered during a pelvic/abdominal ultrasound and further characterized on CT associated with on-going right flank pain and early satiety.   Hospital Course:  The patient underwent laparoscopic right renal cyst decortication with Dr. Liliane Shi on 10/03/20. She tolerated the procedure well and was transferred to the floor after receiving routine post-operative care. Her pain was controlled with analgesics, and her diet was gradually advanced. Her foley was removed on POD1, and she was able to void spontaneously. She became dizzy/lightheaded with ambulating later on POD1, so she stayed for observation.   By later on POD2, she was tolerating a regular diet without nausea/emesis, voiding spontaneously, ambulating without problems, and having pain controlled with oral analgesics. Thus, she was deemed appropriate for discharge home.   Follow up in 2 weeks for post-operative check.   Laboratory values:  Recent Labs    10/04/20 0417 10/05/20 0417 10/05/20 1220  HGB 11.0* 9.5* 9.5*  HCT 33.4* 28.4* 28.5*   Recent Labs    10/04/20 0417  CREATININE 0.72    Disposition: Home  Discharge instruction: The patient was instructed to be ambulatory but told to refrain from heavy lifting, strenuous activity, or driving while taking narcotics.  Discharge medications:  Allergies as of 10/05/2020      Reactions   Diphenhydramine Other (See Comments)   Hyperactivity   Topiramate Diarrhea   Yasmin [drospirenone-ethinyl Estradiol] Other (See Comments)   Migraine Headache   Penicillins Hives      Medication List    TAKE these medications    HYDROcodone-acetaminophen 5-325 MG tablet Commonly known as: Norco Take 1 tablet by mouth every 4 (four) hours as needed for moderate pain.   ibuprofen 200 MG tablet Commonly known as: ADVIL Take 400 mg by mouth every 6 (six) hours as needed for headache or moderate pain.   labetalol 200 MG tablet Commonly known as: NORMODYNE Take 1 tablet (200 mg total) by mouth 2 (two) times daily.   MULTIVITAMIN PO Take 1 tablet by mouth daily. Post-natal vitamin   ondansetron 4 MG tablet Commonly known as: Zofran Take 1 tablet (4 mg total) by mouth daily as needed for nausea or vomiting.       Followup:   Follow-up Information    [facility] In 2 weeks.   Why: My office will call to schedule a postoperative appointment in approximately 2-week Contact information: [address] [place] [address] [place] [address] [phone]

## 2020-10-04 NOTE — Plan of Care (Signed)

## 2020-10-04 NOTE — Progress Notes (Signed)
Patient states she is not able to get up and ambulate due to pain, patient has been medicated with oral and IV pain medication as ordered.

## 2020-10-04 NOTE — Discharge Instructions (Signed)
1.  Activity:  You are encouraged to ambulate frequently (about every hour during waking hours) to help prevent blood clots from forming in your legs or lungs.  However, you should not engage in any heavy lifting (> 10-15 lbs), strenuous activity, or straining. 2. Diet: You should advance your diet as instructed by your physician.  It will be normal to have some bloating, nausea, and abdominal discomfort intermittently. 3. Prescriptions:  You will be provided a prescription for pain medication to take as needed.  If your pain is not severe enough to require the prescription pain medication, you may take extra strength Tylenol instead which will have less side effects. Of note, your pain prescription contains Tylenol. Please be aware of all sources of Tylenol and do not exceed more that 4g or 4,000mg  of Tylenol within a 24 hour period. You should also take a prescribed stool softener to avoid straining with bowel movements as the prescription pain medication may constipate you. 4. Incisions: You may remove your dressing bandages 48 hours after surgery if not removed in the hospital.  You will either have some small staples or special tissue glue at each of the incision sites. Once the bandages are removed (if present), the incisions may stay open to air.  You may start showering (but not soaking or bathing in water) the 2nd day after surgery and the incisions simply need to be patted dry after the shower.  No additional care is needed. 5. What to call us about: You should call the office [phone]) if you develop fever > 101 or develop persistent vomiting.

## 2020-10-05 DIAGNOSIS — N281 Cyst of kidney, acquired: Secondary | ICD-10-CM | POA: Diagnosis not present

## 2020-10-05 LAB — HEMOGLOBIN AND HEMATOCRIT, BLOOD
HCT: 28.4 % — ABNORMAL LOW (ref 36.0–46.0)
HCT: 28.5 % — ABNORMAL LOW (ref 36.0–46.0)
Hemoglobin: 9.5 g/dL — ABNORMAL LOW (ref 12.0–15.0)
Hemoglobin: 9.5 g/dL — ABNORMAL LOW (ref 12.0–15.0)

## 2020-10-05 LAB — SURGICAL PATHOLOGY

## 2020-10-05 MED ORDER — OXYCODONE HCL 5 MG PO TABS: 5.0000 mg | ORAL_TABLET | ORAL | Status: DC | PRN

## 2020-10-05 MED ORDER — OXYCODONE HCL 5 MG PO TABS
5.0000 mg | ORAL_TABLET | ORAL | Status: DC
Start: 2020-10-05 — End: 2020-10-06
  Administered 2020-10-05 – 2020-10-06 (×6): 5 mg via ORAL
  Filled 2020-10-05 (×6): qty 1

## 2020-10-05 MED ORDER — GABAPENTIN 300 MG PO CAPS
300.0000 mg | ORAL_CAPSULE | Freq: Three times a day (TID) | ORAL | Status: DC
  Administered 2020-10-05: 300 mg via ORAL
  Filled 2020-10-05: qty 1

## 2020-10-05 NOTE — Discharge Summary (Signed)
Date of admission: 10/03/2020  Date of discharge: 10/06/2020  Admission diagnosis: Right renal cyst  Discharge diagnosis: Right renal cyst  Secondary diagnoses: None  History and Physical: For full details, please see admission history and physical. Briefly, Kimberly Glass is a 35 y.o. femalewith a Bosniak 2,15 x 12 cm right renal cyst that was initially discovered during a pelvic/abdominal ultrasound and further characterized on CT associated with on-going right flank pain and early satiety.   Hospital Course:  The patient underwent laparoscopic right renal cyst decortication with Dr. Liliane Shi on 10/03/20. She tolerated the procedure well and was transferred to the floor after receiving routine post-operative care. Her pain was controlled with analgesics, and her diet was gradually advanced. Her foley was removed on POD1, and she was able to void spontaneously. She became dizzy/lightheaded with ambulating later on POD1, so she stayed for observation. She got behind on pain medication on POD2, so she preferred to stay an additional night for observation. She had a bowel movement on POD2.    By POD3, she was tolerating a regular diet with return of bowel function, voiding spontaneously, ambulating without problems, and having pain controlled with oral analgesics. Thus, she was deemed appropriate for discharge home.   Follow up in 2 weeks for post-operative check.   Laboratory values:  Recent Labs    10/04/20 0417 10/05/20 0417 10/05/20 1220  HGB 11.0* 9.5* 9.5*  HCT 33.4* 28.4* 28.5*   Recent Labs    10/04/20 0417  CREATININE 0.72    Disposition: Home  Discharge instruction: The patient was instructed to be ambulatory but told to refrain from heavy lifting, strenuous activity, or driving while taking narcotics.  Discharge medications:  Allergies as of 10/05/2020      Reactions   Diphenhydramine Other (See Comments)   Hyperactivity   Topiramate Diarrhea   Yasmin  [drospirenone-ethinyl Estradiol] Other (See Comments)   Migraine Headache   Penicillins Hives      Medication List    TAKE these medications   HYDROcodone-acetaminophen 5-325 MG tablet Commonly known as: Norco Take 1 tablet by mouth every 4 (four) hours as needed for moderate pain.   ibuprofen 200 MG tablet Commonly known as: ADVIL Take 400 mg by mouth every 6 (six) hours as needed for headache or moderate pain.   labetalol 200 MG tablet Commonly known as: NORMODYNE Take 1 tablet (200 mg total) by mouth 2 (two) times daily.   MULTIVITAMIN PO Take 1 tablet by mouth daily. Post-natal vitamin   ondansetron 4 MG tablet Commonly known as: Zofran Take 1 tablet (4 mg total) by mouth daily as needed for nausea or vomiting.       Followup:   Follow-up Information    [facility] In 2 weeks.   Why: My office will call to schedule a postoperative appointment in approximately 2-week Contact information: [address] [place] [address] [phone]

## 2020-10-05 NOTE — Progress Notes (Signed)
Patient pain had been managed wit  PO pain medication, ambulated around room and to the bathroom; patient is passing gas, was encourage to ambulate this AM patient stated she wants to wait until the Tylenol kicks in.

## 2020-10-05 NOTE — Progress Notes (Addendum)
2 Days Post-Op Subjective: Stayed overnight for observation after she got dizzy/lightheaded/nauseous with ambulation yesterday afternoon. Hydration encouraged.   Overall doing well. Incisional pain better controlled. Tolerating regular diet. Hasn't ambulated since yesterday afternoon. Passed TOV with good UOP. Weaned to RA  Hgb continues to downtrend to 9.5 today from 11 yesterday, 12 postop, 13 preop. Mildly tachycardia in 100s yesterday evening that resolved; has remained normotensive.   Objective: Vital signs in last 24 hours: Temp:  [98.5 F (36.9 C)-99.8 F (37.7 C)] 99.5 F (37.5 C) (03/17 0528) Pulse Rate:  [87-104] 92 (03/17 0528) Resp:  [15-20] 20 (03/17 0528) BP: (115-136)/(67-82) 115/67 (03/17 0528) SpO2:  [93 %-99 %] 93 % (03/17 0528)  Intake/Output from previous day: 03/16 0701 - 03/17 0700 In: 120 [P.O.:120] Out: 2700 [Urine:2700] Intake/Output this shift: No intake/output data recorded.  Physical Exam:  General: Laying in bed in NAD CV: Regular rate Lungs: NWOB on [place] Abdomen: Soft, appropriately tender, nondistended. Incisions intact, covered with dermabond, no signs of infection  GU: Voiding spontaneously  Ext: Warm and well-perfused  Lab Results: Recent Labs    10/03/20 1603 10/04/20 0417 10/05/20 0417  HGB 12.0 11.0* 9.5*  HCT 36.7 33.4* 28.4*   BMET Recent Labs    10/04/20 0417  NA 140  K 4.7  CL 105  CO2 25  GLUCOSE 111*  BUN 11  CREATININE 0.72  CALCIUM 9.2     Studies/Results: No results found.  Assessment/Plan: Kimberly Glass is a 35 y.o. female with a Bosniak 2, 15 x 12 cm right renal cyst that was initially discovered during a pelvic/abdominal ultrasound and further characterized on CT associated with on-going right flank pain and early satiety. She is now POD2 s/p laparoscopic right renal cyst decortication. She is overall doing well. She has persistent downtrending Hgb but is HDS.   - Continue regular diet. IVF locked. Bowel  regimen Continue SCH tylenol and lidocaine patches; PRN oxy, dilaudid - PRN anti-emetics - Encourage ambulation, IS, SCDs - Continue home labetalol  - Recheck Hgb at 12 and monitor vitals - Likely home later today    LOS: 0 days   Margette Fast 10/05/2020, 8:08 AM  The patient was independently examined.  I agree with the assessment and plan as outlined by Dr. Lindajo Royal.  Rhoderick Moody, MD

## 2020-10-05 NOTE — TOC Progression Note (Signed)
Transition of Care [facility]) - Progression Note    Patient Details  Name: Kimberly Glass MRN: [id number] Date of Birth: [birth year]  Transition of [facility]) CM/SW Contact  Geni Bers, RN Phone Number: 10/05/2020, 3:15 PM  Clinical Narrative:    There are no HH needs at present time.         Expected Discharge Plan and Services           Expected Discharge Date: 10/05/20                                     Social Determinants of Health (SDOH) Interventions    Readmission Risk Interventions No flowsheet data found.

## 2020-10-06 DIAGNOSIS — N281 Cyst of kidney, acquired: Secondary | ICD-10-CM | POA: Diagnosis not present

## 2020-10-06 NOTE — Progress Notes (Signed)
AVS given to patient and explained at the bedside. Medications and follow up appointments have been explained with pt verbalizing understanding.  

## 2020-10-06 NOTE — Plan of Care (Signed)
Pt alert, able to ambulate 1x around the unit with no episode of dizziness, no n/v thru the night and taken scheduled pain med. Will continue plan of care.  Problem: Health Behavior/Discharge Planning: Goal: Ability to manage health-related needs will improve Outcome: Progressing   Problem: Activity: Goal: Risk for activity intolerance will decrease Outcome: Progressing   Problem: Nutrition: Goal: Adequate nutrition will be maintained Outcome: Progressing   Problem: Pain Managment: Goal: General experience of comfort will improve Outcome: Progressing   Problem: Skin Integrity: Goal: Risk for impaired skin integrity will decrease Outcome: Progressing

## 2020-11-10 ENCOUNTER — Emergency Department (HOSPITAL_COMMUNITY): Payer: 59

## 2020-11-10 ENCOUNTER — Other Ambulatory Visit: Payer: Self-pay

## 2020-11-10 ENCOUNTER — Encounter: Payer: Self-pay | Admitting: Emergency Medicine

## 2020-11-10 ENCOUNTER — Encounter (HOSPITAL_COMMUNITY): Payer: Self-pay

## 2020-11-10 ENCOUNTER — Ambulatory Visit
Admission: EM | Admit: 2020-11-10 | Discharge: 2020-11-10 | Disposition: A | Discharge status: Home / Self Care | Payer: 59 | Attending: Internal Medicine | Admitting: Internal Medicine

## 2020-11-10 ENCOUNTER — Emergency Department (HOSPITAL_COMMUNITY)
Admission: EM | Admit: 2020-11-10 | Discharge: 2020-11-10 | Disposition: A | Discharge status: Home / Self Care | Payer: 59 | Attending: Emergency Medicine | Admitting: Emergency Medicine

## 2020-11-10 DIAGNOSIS — Z79899 Other long term (current) drug therapy: Secondary | ICD-10-CM | POA: Insufficient documentation

## 2020-11-10 DIAGNOSIS — R1032 Left lower quadrant pain: Secondary | ICD-10-CM | POA: Diagnosis not present

## 2020-11-10 DIAGNOSIS — I10 Essential (primary) hypertension: Secondary | ICD-10-CM | POA: Insufficient documentation

## 2020-11-10 DIAGNOSIS — R197 Diarrhea, unspecified: Secondary | ICD-10-CM

## 2020-11-10 DIAGNOSIS — R109 Unspecified abdominal pain: Secondary | ICD-10-CM | POA: Diagnosis present

## 2020-11-10 LAB — CBC WITH DIFFERENTIAL/PLATELET
Abs Immature Granulocytes: 0.02 10*3/uL (ref 0.00–0.07)
Basophils Absolute: 0 10*3/uL (ref 0.0–0.1)
Basophils Relative: 1 %
Eosinophils Absolute: 0.1 10*3/uL (ref 0.0–0.5)
Eosinophils Relative: 1 %
HCT: 38.9 % (ref 36.0–46.0)
Hemoglobin: 12.5 g/dL (ref 12.0–15.0)
Immature Granulocytes: 0 %
Lymphocytes Relative: 25 %
Lymphs Abs: 1.5 10*3/uL (ref 0.7–4.0)
MCH: 29.4 pg (ref 26.0–34.0)
MCHC: 32.1 g/dL (ref 30.0–36.0)
MCV: 91.5 fL (ref 80.0–100.0)
Monocytes Absolute: 0.4 10*3/uL (ref 0.1–1.0)
Monocytes Relative: 6 %
Neutro Abs: 4 10*3/uL (ref 1.7–7.7)
Neutrophils Relative %: 67 %
Platelets: 360 10*3/uL (ref 150–400)
RBC: 4.25 MIL/uL (ref 3.87–5.11)
RDW: 13.3 % (ref 11.5–15.5)
WBC: 6.1 10*3/uL (ref 4.0–10.5)
nRBC: 0 % (ref 0.0–0.2)

## 2020-11-10 LAB — POCT URINALYSIS DIP (MANUAL ENTRY)
Bilirubin, UA: NEGATIVE
Glucose, UA: NEGATIVE mg/dL
Ketones, POC UA: NEGATIVE mg/dL
Leukocytes, UA: NEGATIVE
Nitrite, UA: NEGATIVE
Protein Ur, POC: 100 mg/dL — AB
Spec Grav, UA: 1.02 (ref 1.010–1.025)
Urobilinogen, UA: 0.2 E.U./dL
pH, UA: 6.5 (ref 5.0–8.0)

## 2020-11-10 LAB — COMPREHENSIVE METABOLIC PANEL
ALT: 13 U/L (ref 0–44)
AST: 12 U/L — ABNORMAL LOW (ref 15–41)
Albumin: 4.4 g/dL (ref 3.5–5.0)
Alkaline Phosphatase: 63 U/L (ref 38–126)
Anion gap: 10 (ref 5–15)
BUN: 14 mg/dL (ref 6–20)
CO2: 24 mmol/L (ref 22–32)
Calcium: 9.3 mg/dL (ref 8.9–10.3)
Chloride: 103 mmol/L (ref 98–111)
Creatinine, Ser: 0.63 mg/dL (ref 0.44–1.00)
GFR, Estimated: >60 mL/min (ref >60)
Glucose, Bld: 104 mg/dL — ABNORMAL HIGH (ref 70–99)
Potassium: 4.1 mmol/L (ref 3.5–5.1)
Sodium: 137 mmol/L (ref 135–145)
Total Bilirubin: 1.2 mg/dL (ref 0.3–1.2)
Total Protein: 7.7 g/dL (ref 6.5–8.1)

## 2020-11-10 LAB — PREGNANCY, URINE: Preg Test, Ur: NEGATIVE

## 2020-11-10 LAB — LIPASE, BLOOD: Lipase: 23 U/L (ref 11–51)

## 2020-11-10 MED ORDER — IOHEXOL 300 MG/ML  SOLN
100.0000 mL | Freq: Once | INTRAMUSCULAR | Status: AC | PRN
  Administered 2020-11-10: 100 mL via INTRAVENOUS

## 2020-11-10 NOTE — Discharge Instructions (Addendum)
I have attached information on a low FODMAP diet.  Try to implement this into your diet to help with your diarrhea and gastric pain.  I would also recommend you begin taking a fiber supplement.  I recommend Metamucil.  You can purchase this over-the-counter.  If your symptoms persist, please make sure you follow-up with your regular doctor.  They should be able to obtain stool studies as well to check you for any type of infectious diarrhea, including C. difficile.  Please continue to monitor your symptoms.  If they worsen, you can always return to the emergency department for reevaluation.  It was a pleasure to meet you.

## 2020-11-10 NOTE — ED Triage Notes (Signed)
Pt presents to ED with complaints of left lower abdominal pain x 2-3 days this episode. Pt states she had surgery to have a cyst on her right kidney removed a couple months ago and had pain on her left side then but states came back 2-3 days ago. Pt states also had diarrhea, 2 episodes in last 24 hours.

## 2020-11-10 NOTE — ED Notes (Signed)
Pt attempted to give stool sample and was unsuccessful.

## 2020-11-10 NOTE — ED Provider Notes (Signed)
[facility]    CSN: [id number] Arrival date & time: 11/10/20  0930      History   Chief Complaint No chief complaint on file.   HPI Kimberly Glass is a 35 y.o. female who presents with LLQ pain x 3 weeks Had a R renal cyst removed  3 weeks ago and due to having a fever of unknown cause was placed on antibiotics, and while on antibiotics developed diarrha and was told to take Imodium which helped. But the  LLQ pain never resolved, and this week is worse with lifting or twisting or stretching back. Has not noticed any bulging on this side. Denies an injury.  Denies UTI symptoms. The diarrhea came back this week and has had about 4 diarrhea BM's in the past 5 days. Denies fever.   Past Medical History:  Diagnosis Date  . Acne vulgaris   . Allergic rhinitis, cause unspecified   . Anxiety   . Endometriosis dx Jan 2014  . Erythema nodosum    associated with menses; improved with OCP  . Essential hypertension, benign   . Family history of adverse reaction to anesthesia    mother has N&V  . H/O pilonidal cyst   . Headache, migraine    rare  . Obesity, unspecified     Patient Active Problem List   Diagnosis Date Noted  . Renal cyst 10/03/2020  . Encounter for induction of labor 04/27/2019  . Bilateral hip pain 03/12/2017  . Hyperlipidemia 12/31/2016  . Pilonidal disease 01/11/2014  . Anxiety 06/22/2013  . BMI 40.0-44.9, adult (HCC) 06/22/2013  . Migraine 05/10/2013  . Endometriosis   . Irregular menses 07/30/2012  . Dysmenorrhea 07/30/2012  . Female pelvic pain 07/30/2012  . Vulvar lesion 07/30/2012  . Essential hypertension, benign     Past Surgical History:  Procedure Laterality Date  . LAPAROSCOPIC NEPHRECTOMY Right 10/03/2020   Procedure: LAPAROSCOPIC RENAL CYST DECORTICATION;  Surgeon: Rene Paci, MD;  Location: [facility];  Service: Urology;  Laterality: Right;  ONLY NEEDS 120 MIN FOR PROCEDURE  . OPEN REDUCTION INTERNAL FIXATION (ORIF)  METACARPAL Right 10/28/2014   Procedure: OPEN REDUCTION INTERNAL FIXATION RIGHT FINGER METACARPAL FRACTURE;  Surgeon: Dairl Ponder, MD;  Location: [facility];  Service: Orthopedics;  Laterality: Right;  . ORIF RADIAL FRACTURE Right 10/28/2014   Procedure: OPEN REDUCTION INTERNAL FIXATION RIGHT RADIUS FRACTURE;  Surgeon: Dairl Ponder, MD;  Location: [facility];  Service: Orthopedics;  Laterality: Right;  . ORIF SCAPHOID FRACTURE Right 10/28/2014   Procedure: OPEN REDUCTION INTERNAL FIXATION (ORIF) SCAPHOID FRACTURE;  Surgeon: Dairl Ponder, MD;  Location: [facility];  Service: Orthopedics;  Laterality: Right;  . PILONIDAL CYST EXCISION  01/30/2015   Ardeth Sportsman, MD  . WISDOM TOOTH EXTRACTION      OB History    Gravida  1   Para  1   Term  1   Preterm  0   AB  0   Living  1     SAB  0   IAB  0   Ectopic  0   Multiple  0   Live Births  1            Home Medications    Prior to Admission medications   Medication Sig Start Date End Date Taking? Authorizing Provider  HYDROcodone-acetaminophen (NORCO) 5-325 MG tablet Take 1 tablet by mouth every 4 (four) hours as needed for moderate pain. 10/04/20   Rene Paci, MD  ibuprofen (ADVIL)  200 MG tablet Take 400 mg by mouth every 6 (six) hours as needed for headache or moderate pain.    [provider]  labetalol (NORMODYNE) 200 MG tablet Take 1 tablet (200 mg total) by mouth 2 (two) times daily. 04/29/19   Morris, Aundra Millet, DO  Multiple Vitamins-Minerals (MULTIVITAMIN PO) Take 1 tablet by mouth daily. Post-natal vitamin    [provider]  ondansetron (ZOFRAN) 4 MG tablet Take 1 tablet (4 mg total) by mouth daily as needed for nausea or vomiting. 10/04/20 10/04/21  Rene Paci, MD    Family History Family History  Problem Relation Age of Onset  . Heart disease Father 24       AMI; stents  . Hypertension Father   . Hyperlipidemia Father   . Diabetes Maternal Grandmother   . Endometriosis  Maternal Grandmother   . Cancer Mother        kidney  . Endometriosis Mother   . Cancer Maternal Grandfather   . Stroke Paternal Grandfather   . Hypertension Paternal Grandfather   . Hyperlipidemia Paternal Grandfather   . Heart disease Paternal Grandfather     Social History Social History   Tobacco Use  . Smoking status: Never Smoker  . Smokeless tobacco: Never Used  Vaping Use  . Vaping Use: Never used  Substance Use Topics  . Alcohol use: Yes    Alcohol/week: 1.0 standard drink    Types: 1 Cans of beer per week    Comment: rarely   . Drug use: No     Allergies   Diphenhydramine, Topiramate, Yasmin [drospirenone-ethinyl estradiol], and Penicillins   Review of Systems Review of Systems  Constitutional: Negative for activity change, appetite change, chills, diaphoresis and fever.  Gastrointestinal: Positive for abdominal pain and diarrhea. Negative for blood in stool and vomiting.  Genitourinary: Negative for dysuria, frequency, hematuria and urgency.  Musculoskeletal: Negative for gait problem and myalgias.  Skin: Negative for rash and wound.  Hematological: Negative for adenopathy.     Physical Exam Triage Vital Signs ED Triage Vitals  Enc Vitals Group     BP 11/10/20 0957 138/87     Pulse Rate 11/10/20 0957 (!) 103     Resp 11/10/20 0957 18     Temp 11/10/20 0957 98.9 F (37.2 C)     Temp Source 11/10/20 0957 Oral     SpO2 11/10/20 0957 98 %     Weight --      Height --      Head Circumference --      Peak Flow --      Pain Score 11/10/20 0959 4     Pain Loc --      Pain Edu? --      Excl. in GC? --    No data found.  Updated Vital Signs BP 138/87 (BP Location: Right Arm)   Pulse (!) 103   Temp 98.9 F (37.2 C) (Oral)   Resp 18   SpO2 98%   Visual Acuity Right Eye Distance:   Left Eye Distance:   Bilateral Distance:    Right Eye Near:   Left Eye Near:    Bilateral Near:     Physical Exam Vitals and nursing note reviewed.  HENT:      Right Ear: External ear normal.     Left Ear: External ear normal.  Eyes:     Conjunctiva/sclera: Conjunctivae normal.  Pulmonary:     Effort: Pulmonary effort is normal.  Abdominal:  General: There is no distension.     Palpations: There is no mass.     Comments: Has moderate tenderness on LLQ and L mid abd with no guarding or rebound.   Musculoskeletal:        General: Normal range of motion.     Cervical back: Neck supple.  Skin:    General: Skin is warm and dry.     Findings: No rash.  Neurological:     Mental Status: She is alert and oriented to person, place, and time.     Gait: Gait normal.  Psychiatric:        Mood and Affect: Mood normal.        Behavior: Behavior normal.        Thought Content: Thought content normal.        Judgment: Judgment normal.    UC Treatments / Results  Labs (all labs ordered are listed, but only abnormal results are displayed) Labs Reviewed  POCT URINALYSIS DIP (MANUAL ENTRY) - Abnormal; Notable for the following components:      Result Value   Blood, UA trace-lysed (*)    Protein Ur, POC =100 (*)    All other components within normal limits    EKG   Radiology No results found.  Procedures Procedures (including critical care time)  Medications Ordered in UC Medications - No data to display  Initial Impression / Assessment and Plan / UC Course  I have reviewed the triage vital signs and the nursing notes. Pertinent labs  results that were available during my care of the patient were reviewed by me and considered in my medical decision making (see chart for details). She was sent to ER for further work up.  Final Clinical Impressions(s) / UC Diagnoses   Final diagnoses:  Abdominal pain, left lower quadrant     Discharge Instructions     Please go to ER for further work up     ED Prescriptions    None     PDMP not reviewed this encounter.   Garey Ham, PA-C 11/10/20 1039

## 2020-11-10 NOTE — Discharge Instructions (Signed)
Please go to ER for further work up

## 2020-11-10 NOTE — ED Provider Notes (Signed)
[facility] EMERGENCY DEPARTMENT Provider Note   CSN: [id number] Arrival date & time: 11/10/20  1051     History Chief Complaint  Patient presents with  . Abdominal Pain    Kimberly Glass is a 35 y.o. female.  HPI   Patient is a 35 year old female who presents emergency department due to left-sided abdominal pain.  Patient states that she had a right renal cyst removed on March 15 by Dr. Sande Brothers.  She states after this operation she developed persistent diarrhea, left-sided abdominal pain, as well as an unexplained fever.  She was placed on ciprofloxacin which she did not tolerate and was then placed on Keflex which she did tolerate.  She states her symptoms are improved.  She states that about 3 days ago she ate Dione [employer] and afterwards began experiencing similar left-sided abdominal pain as well as watery brown diarrhea.  No hematochezia.  No nausea or vomiting.  She was evaluated at urgent care and had a reassuring UA and was told to come to the emergency department for a CT scan.  Denies any vaginal discharge or urinary complaints.  She has an IUD in place and does not have a regular menstrual cycle.  She is currently breast-feeding.     Past Medical History:  Diagnosis Date  . Acne vulgaris   . Allergic rhinitis, cause unspecified   . Anxiety   . Endometriosis dx Jan 2014  . Erythema nodosum    associated with menses; improved with OCP  . Essential hypertension, benign   . Family history of adverse reaction to anesthesia    mother has N&V  . H/O pilonidal cyst   . Headache, migraine    rare  . Obesity, unspecified     Patient Active Problem List   Diagnosis Date Noted  . Renal cyst 10/03/2020  . Encounter for induction of labor 04/27/2019  . Bilateral hip pain 03/12/2017  . Hyperlipidemia 12/31/2016  . Pilonidal disease 01/11/2014  . Anxiety 06/22/2013  . BMI 40.0-44.9, adult (HCC) 06/22/2013  . Migraine 05/10/2013  . Endometriosis   . Irregular menses  07/30/2012  . Dysmenorrhea 07/30/2012  . Female pelvic pain 07/30/2012  . Vulvar lesion 07/30/2012  . Essential hypertension, benign     Past Surgical History:  Procedure Laterality Date  . LAPAROSCOPIC NEPHRECTOMY Right 10/03/2020   Procedure: LAPAROSCOPIC RENAL CYST DECORTICATION;  Surgeon: Rene Paci, MD;  Location: [facility];  Service: Urology;  Laterality: Right;  ONLY NEEDS 120 MIN FOR PROCEDURE  . OPEN REDUCTION INTERNAL FIXATION (ORIF) METACARPAL Right 10/28/2014   Procedure: OPEN REDUCTION INTERNAL FIXATION RIGHT FINGER METACARPAL FRACTURE;  Surgeon: Dairl Ponder, MD;  Location: [facility];  Service: Orthopedics;  Laterality: Right;  . ORIF RADIAL FRACTURE Right 10/28/2014   Procedure: OPEN REDUCTION INTERNAL FIXATION RIGHT RADIUS FRACTURE;  Surgeon: Dairl Ponder, MD;  Location: [facility];  Service: Orthopedics;  Laterality: Right;  . ORIF SCAPHOID FRACTURE Right 10/28/2014   Procedure: OPEN REDUCTION INTERNAL FIXATION (ORIF) SCAPHOID FRACTURE;  Surgeon: Dairl Ponder, MD;  Location: [facility];  Service: Orthopedics;  Laterality: Right;  . PILONIDAL CYST EXCISION  01/30/2015   Ardeth Sportsman, MD  . WISDOM TOOTH EXTRACTION       OB History    Gravida  1   Para  1   Term  1   Preterm  0   AB  0   Living  1     SAB  0   IAB  0  Ectopic  0   Multiple  0   Live Births  1           Family History  Problem Relation Age of Onset  . Heart disease Father 71       AMI; stents  . Hypertension Father   . Hyperlipidemia Father   . Diabetes Maternal Grandmother   . Endometriosis Maternal Grandmother   . Cancer Mother        kidney  . Endometriosis Mother   . Cancer Maternal Grandfather   . Stroke Paternal Grandfather   . Hypertension Paternal Grandfather   . Hyperlipidemia Paternal Grandfather   . Heart disease Paternal Grandfather     Social History   Tobacco Use  . Smoking status: Never Smoker  . Smokeless tobacco: Never Used  Vaping Use  .  Vaping Use: Never used  Substance Use Topics  . Alcohol use: Yes    Alcohol/week: 1.0 standard drink    Types: 1 Cans of beer per week    Comment: rarely   . Drug use: No    Home Medications Prior to Admission medications   Medication Sig Start Date End Date Taking? Authorizing Provider  labetalol (NORMODYNE) 200 MG tablet Take 1 tablet (200 mg total) by mouth 2 (two) times daily. 04/29/19  Yes Morris, Megan, DO  Multiple Vitamins-Minerals (MULTIVITAMIN PO) Take 1 tablet by mouth daily. Post-natal vitamin   Yes [provider]  HYDROcodone-acetaminophen (NORCO) 5-325 MG tablet Take 1 tablet by mouth every 4 (four) hours as needed for moderate pain. Patient not taking: No sig reported 10/04/20   Rene Paci, MD  ondansetron (ZOFRAN) 4 MG tablet Take 1 tablet (4 mg total) by mouth daily as needed for nausea or vomiting. Patient not taking: No sig reported 10/04/20 10/04/21  Rene Paci, MD    Allergies    Diphenhydramine, Topiramate, Yasmin [drospirenone-ethinyl estradiol], and Penicillins  Review of Systems   Review of Systems  All other systems reviewed and are negative. Ten systems reviewed and are negative for acute change, except as noted in the HPI.   Physical Exam Updated Vital Signs BP 130/87 (BP Location: Left Arm)   Pulse 84   Temp 98.2 F (36.8 C) (Oral)   Resp 17   Ht 5\' 9"  (1.753 m)   Wt 120.2 kg   SpO2 98%   BMI 39.13 kg/m   Physical Exam Vitals and nursing note reviewed.  Constitutional:      General: She is not in acute distress.    Appearance: Normal appearance. She is not ill-appearing, toxic-appearing or diaphoretic.  HENT:     Head: Normocephalic and atraumatic.     Right Ear: External ear normal.     Left Ear: External ear normal.     Nose: Nose normal.     Mouth/Throat:     Mouth: Mucous membranes are moist.     Pharynx: Oropharynx is clear. No oropharyngeal exudate or posterior oropharyngeal erythema.  Eyes:      Extraocular Movements: Extraocular movements intact.  Cardiovascular:     Rate and Rhythm: Normal rate and regular rhythm.     Pulses: Normal pulses.     Heart sounds: Normal heart sounds. No murmur heard. No friction rub. No gallop.   Pulmonary:     Effort: Pulmonary effort is normal. No respiratory distress.     Breath sounds: Normal breath sounds. No stridor. No wheezing, rhonchi or rales.  Abdominal:     General: Abdomen is  flat.     Tenderness: There is abdominal tenderness.     Comments: Protuberant abdomen that is soft.  Moderate tenderness noted along the left lower quadrant as well as the central left lateral abdomen.  No rebound.  Negative Murphy sign.  No McBurney point tenderness.  Musculoskeletal:        General: Normal range of motion.     Cervical back: Normal range of motion and neck supple. No tenderness.  Skin:    General: Skin is warm and dry.  Neurological:     General: No focal deficit present.     Mental Status: She is alert and oriented to person, place, and time.  Psychiatric:        Mood and Affect: Mood normal.        Behavior: Behavior normal.    ED Results / Procedures / Treatments   Labs (all labs ordered are listed, but only abnormal results are displayed) Labs Reviewed  COMPREHENSIVE METABOLIC PANEL - Abnormal; Notable for the following components:      Result Value   Glucose, Bld 104 (*)    AST 12 (*)    All other components within normal limits  GASTROINTESTINAL PANEL BY PCR, STOOL (REPLACES STOOL CULTURE)  C DIFFICILE QUICK SCREEN W PCR REFLEX  CBC WITH DIFFERENTIAL/PLATELET  PREGNANCY, URINE  LIPASE, BLOOD    EKG None  Radiology CT ABDOMEN PELVIS W CONTRAST  Result Date: 11/10/2020 CLINICAL DATA:  Lower abdominal pain for 2-3 days. Diarrhea over the past 24 hours. Patient status post renal cyst decortication 10/03/2020. EXAM: CT ABDOMEN AND PELVIS WITH CONTRAST TECHNIQUE: Multidetector CT imaging of the abdomen and pelvis was  performed using the standard protocol following bolus administration of intravenous contrast. CONTRAST:  100 mL OMNIPAQUE IOHEXOL 300 MG/ML  SOLN COMPARISON:  CT abdomen and pelvis 07/13/2019. FINDINGS: Lower chest: Lung bases clear. Heart size normal. No pleural or pericardial effusion. Hepatobiliary: No focal liver abnormality is seen. No gallstones, gallbladder wall thickening, or biliary dilatation. Pancreas: Unremarkable. No pancreatic ductal dilatation or surrounding inflammatory changes. Spleen: Normal in size without focal abnormality. Adrenals/Urinary Tract: The adrenal glands appear normal. The previously seen right renal cyst which had measured 16.7 cm craniocaudal by 15.0 by 12.6 cm in the axial plane now measures 9.8 cm craniocaudal by 6.6 by 6.4 cm in the axial plane consistent with postsurgical change. There is mild stranding about the lateral periphery of the cyst. The right kidney is otherwise unremarkable. The left kidney appears normal. Ureters and urinary bladder appear normal. Stomach/Bowel: Stomach is within normal limits. The appendix appears normal. No evidence of bowel wall thickening, distention, or inflammatory changes. Vascular/Lymphatic: No significant vascular findings are present. No enlarged abdominal or pelvic lymph nodes. Reproductive: Uterus and bilateral adnexa are unremarkable. IUD noted. Other: None. Musculoskeletal: No acute or focal abnormality. Mild convex left lumbar scoliosis with the apex L2-3 is noted. IMPRESSION: No acute abnormality abdomen or pelvis. Decrease in the size of a right renal cyst since the prior CT consistent with postoperative change. Mild stranding about the periphery of the cyst is likely due to the patient's recent surgery. Electronically Signed   By: Drusilla Kannerhomas  Dalessio M.D.   On: 11/10/2020 14:29   Procedures Procedures   Medications Ordered in ED Medications  iohexol (OMNIPAQUE) 300 MG/ML solution 100 mL (100 mLs Intravenous Contrast Given  11/10/20 1346)   ED Course  I have reviewed the triage vital signs and the nursing notes.  Pertinent labs & imaging results  that were available during my care of the patient were reviewed by me and considered in my medical decision making (see chart for details).  Clinical Course as of 11/10/20 1522  Fri Nov 10, 2020  1441 CT ABDOMEN PELVIS W CONTRAST IMPRESSION: No acute abnormality abdomen or pelvis.  Decrease in the size of a right renal cyst since the prior CT consistent with postoperative change. Mild stranding about the periphery of the cyst is likely due to the patient's recent surgery. [LJ]    Clinical Course User Index [LJ] Placido Sou, PA-C   MDM Rules/Calculators/A&P                          Pt is a 35 y.o. female who presents the emergency department due to left-sided abdominal pain as well as diarrhea.  Labs: CBC with differential without abnormalities. CMP with a glucose of 104 and an AST of 12. Lipase of 23. Pregnancy test negative. UA obtained in urgent care prior to arrival and was reassuring.  Imaging: CT scan of the abdomen and pelvis shows no acute abnormality within the abdomen or pelvis.  I, Placido Sou, PA-C, personally reviewed and evaluated these images and lab results as part of my medical decision-making.  Unsure of the cause of the patient's symptoms.  She did have recent antibiotic use last month after her operation and took ciprofloxacin as well as Keflex.  Possibly C. difficile. Patient in the emergency department for many hours and unable to provide a stool sample.  Her tenderness is on the left side of the abdomen and worst along the left mid abdomen.  No pelvic pain.  No GU complaints.  Recommended patient begin a fiber supplement.  Discussed a low FODMAP diet.  She is going to follow-up with her PCP if her symptoms persist.  We discussed return precautions in length.  Feel the patient is stable for discharge at this time and she is  agreeable.  Her questions were answered and she was amicable at the time of discharge.  Note: Portions of this report may have been transcribed using voice recognition software. Every effort was made to ensure accuracy; however, inadvertent computerized transcription errors may be present.   Final Clinical Impression(s) / ED Diagnoses Final diagnoses:  Abdominal pain, unspecified abdominal location  Diarrhea, unspecified type    Rx / DC Orders ED Discharge Orders    None       Placido Sou, PA-C 11/10/20 1523    Jacalyn Lefevre, MD 11/11/20 1644

## 2020-11-10 NOTE — ED Notes (Signed)
Pt back from CT

## 2020-11-10 NOTE — ED Triage Notes (Signed)
Hx of cyst remove on right knee mid March.  Was on antibiotics x 3 weeks with diarrhea.  Pain off and on in lower left abd.  Hurts more with movement.

## 2021-03-28 ENCOUNTER — Ambulatory Visit
Admission: EM | Admit: 2021-03-28 | Discharge: 2021-03-28 | Disposition: A | Discharge status: Home / Self Care | Payer: 59 | Attending: Family Medicine | Admitting: Family Medicine

## 2021-03-28 ENCOUNTER — Other Ambulatory Visit: Payer: Self-pay

## 2021-03-28 ENCOUNTER — Encounter: Payer: Self-pay | Admitting: Emergency Medicine

## 2021-03-28 DIAGNOSIS — Z5189 Encounter for other specified aftercare: Secondary | ICD-10-CM | POA: Diagnosis not present

## 2021-03-28 DIAGNOSIS — L0501 Pilonidal cyst with abscess: Secondary | ICD-10-CM

## 2021-03-28 NOTE — ED Triage Notes (Addendum)
Had pilonidal cyst drained on Friday.  Here today for wound check. Pcp had no appointments.

## 2021-03-31 NOTE — ED Provider Notes (Signed)
[facility] CARE CENTER   [id number] 03/28/21 Arrival Time: 1609  ASSESSMENT & PLAN:  1. Pilonidal abscess   2. Wound check, abscess    Wound looks good. Packing removed. None placed. Should heal well. May f/u as needed.  Reviewed expectations re: course of current medical issues. Questions answered. Outlined signs and symptoms indicating need for more acute intervention. Patient verbalized understanding. After Visit Summary given.   SUBJECTIVE:  Kimberly Glass is a 35 y.o. female who reports I&D of pilonidal cyst at outside facility. Here for pcking removal and recheck. Feeling much better. Afebrile.     OBJECTIVE:  Vitals:   03/28/21 1648  BP: 132/88  Pulse: 82  Resp: 19  Temp: 98.2 F (36.8 C)  TempSrc: Oral  SpO2: 96%     General appearance: alert; no distress Buttock: approx 1 cm incision of upper L gluteal cleft; packing removed without complication; no active drainage or bleeding Psychological: alert and cooperative; normal mood and affect  Allergies  Allergen Reactions   Diphenhydramine Other (See Comments)    Hyperactivity   Topiramate Diarrhea   Yasmin [Drospirenone-Ethinyl Estradiol] Other (See Comments)    Migraine Headache   Penicillins Hives    Past Medical History:  Diagnosis Date   Acne vulgaris    Allergic rhinitis, cause unspecified    Anxiety    Endometriosis dx Jan 2014   Erythema nodosum    associated with menses; improved with OCP   Essential hypertension, benign    Family history of adverse reaction to anesthesia    mother has N&V   H/O pilonidal cyst    Headache, migraine    rare   Obesity, unspecified    Social History   Socioeconomic History   Marital status: Married    Spouse name: Ronaldo Miyamoto Demeter   Number of children: 0   Years of education: college   Highest education level: Not on file  Occupational History   Occupation: Engineer, civil (consulting): [employer]    Comment: [employer]  Tobacco Use    Smoking status: Never   Smokeless tobacco: Never  Vaping Use   Vaping Use: Never used  Substance and Sexual Activity   Alcohol use: Yes    Alcohol/week: 1.0 standard drink    Types: 1 Cans of beer per week    Comment: rarely    Drug use: No   Sexual activity: Yes    Partners: Male    Birth control/protection: I.U.D.  Other Topics Concern   Not on file  Social History Narrative   Lives with her husband (married 05/04/2016) in the house she purchased 03/27/2014.   Her parents live nearby.   Her brother, sister-in-law and nephew live nearby.   Social Determinants of Health   Financial Resource Strain: Not on file  Food Insecurity: Not on file  Transportation Needs: Not on file  Physical Activity: Not on file  Stress: Not on file  Social Connections: Not on file   Family History  Problem Relation Age of Onset   Heart disease Father 73       AMI; stents   Hypertension Father    Hyperlipidemia Father    Diabetes Maternal Grandmother    Endometriosis Maternal Grandmother    Cancer Mother        kidney   Endometriosis Mother    Cancer Maternal Grandfather    Stroke Paternal Grandfather    Hypertension Paternal Grandfather    Hyperlipidemia Paternal Actor  Heart disease Paternal Grandfather    Past Surgical History:  Procedure Laterality Date   LAPAROSCOPIC NEPHRECTOMY Right 10/03/2020   Procedure: LAPAROSCOPIC RENAL CYST DECORTICATION;  Surgeon: Rene Paci, MD;  Location: [facility];  Service: Urology;  Laterality: Right;  ONLY NEEDS 120 MIN FOR PROCEDURE   OPEN REDUCTION INTERNAL FIXATION (ORIF) METACARPAL Right 10/28/2014   Procedure: OPEN REDUCTION INTERNAL FIXATION RIGHT FINGER METACARPAL FRACTURE;  Surgeon: Dairl Ponder, MD;  Location: [facility];  Service: Orthopedics;  Laterality: Right;   ORIF RADIAL FRACTURE Right 10/28/2014   Procedure: OPEN REDUCTION INTERNAL FIXATION RIGHT RADIUS FRACTURE;  Surgeon: Dairl Ponder, MD;  Location: [facility];  Service:  Orthopedics;  Laterality: Right;   ORIF SCAPHOID FRACTURE Right 10/28/2014   Procedure: OPEN REDUCTION INTERNAL FIXATION (ORIF) SCAPHOID FRACTURE;  Surgeon: Dairl Ponder, MD;  Location: [facility];  Service: Orthopedics;  Laterality: Right;   PILONIDAL CYST EXCISION  01/30/2015   Ardeth Sportsman, MD   WISDOM TOOTH EXTRACTION              Mardella Layman, MD 03/31/21 1009

## 2021-07-04 ENCOUNTER — Ambulatory Visit
Admission: EM | Admit: 2021-07-04 | Discharge: 2021-07-04 | Disposition: A | Discharge status: Home / Self Care | Payer: 59 | Attending: Family Medicine | Admitting: Family Medicine

## 2021-07-04 ENCOUNTER — Other Ambulatory Visit: Payer: Self-pay

## 2021-07-04 DIAGNOSIS — R509 Fever, unspecified: Secondary | ICD-10-CM

## 2021-07-04 DIAGNOSIS — J358 Other chronic diseases of tonsils and adenoids: Secondary | ICD-10-CM

## 2021-07-04 NOTE — Discharge Instructions (Signed)
You have been tested for COVID-19/influenza today. If your test returns positive, you will receive a phone call from [facility] regarding your results. Negative test results are not called. Both positive and negative results area always visible on MyChart. If you do not have a MyChart account, sign up instructions are provided in your discharge papers. Please do not hesitate to contact us should you have questions or concerns.  

## 2021-07-04 NOTE — ED Triage Notes (Signed)
Pt presents with c/o  enlarged tonsil that appears unusual than normal

## 2021-07-05 LAB — COVID-19, FLU A+B NAA
Influenza A, NAA: NOT DETECTED
Influenza B, NAA: NOT DETECTED
SARS-CoV-2, NAA: NOT DETECTED

## 2021-07-07 NOTE — ED Provider Notes (Signed)
[facility] CARE CENTER   [id number] 07/04/21 Arrival Time: 1747  ASSESSMENT & PLAN:  1. Low grade fever   2. Tonsillar cyst    Will continue PPI. Does not need refills. Noted low grade temp today. Will monitor symptoms.  Discussed typical duration of symptoms for suspected viral GI illness. Will do her best to ensure adequate fluid intake in order to avoid dehydration. Will proceed to the Emergency Department for evaluation if unable to tolerate PO fluids regularly.  Otherwise she will f/u with her PCP or here if not showing improvement over the next 48-72 hours.  Reviewed expectations re: course of current medical issues. Questions answered. Outlined signs and symptoms indicating need for more acute intervention. Patient verbalized understanding. After Visit Summary given.   SUBJECTIVE: History from: patient.  Jaionna Weisse Bolar is a 35 y.o. female who reports reflux exacerbation. Feeling of something in throat. No fevers reported. Normal PO intake without n/v.   No LMP recorded. (Menstrual status: IUD).  Past Surgical History:  Procedure Laterality Date   LAPAROSCOPIC NEPHRECTOMY Right 10/03/2020   Procedure: LAPAROSCOPIC RENAL CYST DECORTICATION;  Surgeon: Rene Paci, MD;  Location: [facility];  Service: Urology;  Laterality: Right;  ONLY NEEDS 120 MIN FOR PROCEDURE   OPEN REDUCTION INTERNAL FIXATION (ORIF) METACARPAL Right 10/28/2014   Procedure: OPEN REDUCTION INTERNAL FIXATION RIGHT FINGER METACARPAL FRACTURE;  Surgeon: Dairl Ponder, MD;  [facility];  Service: Orthopedics;  Laterality: Right;   ORIF RADIAL FRACTURE Right 10/28/2014   Procedure: OPEN REDUCTION INTERNAL FIXATION RIGHT RADIUS FRACTURE;  Surgeon: Dairl Ponder, MD;  [facility]: [facility];  Service: Orthopedics;  Laterality: Right;   ORIF SCAPHOID FRACTURE Right 10/28/2014   Procedure: OPEN REDUCTION INTERNAL FIXATION (ORIF) SCAPHOID FRACTURE;  Surgeon: Dairl Ponder, MD;  Location: [facility];   Service: Orthopedics;  Laterality: Right;   PILONIDAL CYST EXCISION  01/30/2015   Ardeth Sportsman, MD   WISDOM TOOTH EXTRACTION      ROS: As per HPI.  OBJECTIVE:  Vitals:   07/04/21 1817  BP: (!) 156/85  Pulse: 90  Resp: 18  Temp: 100.3 F (37.9 C)  SpO2: 98%    General appearance: alert; no distress Oropharynx: normal Lungs: clear to auscultation bilaterally; unlabored Neurologic: normal gait Psychological: alert and cooperative; normal mood and affect   Allergies  Allergen Reactions   Diphenhydramine Other (See Comments)    Hyperactivity   Topiramate Diarrhea   Yasmin [Drospirenone-Ethinyl Estradiol] Other (See Comments)    Migraine Headache   Penicillins Hives                                               Past Medical History:  Diagnosis Date   Acne vulgaris    Allergic rhinitis, cause unspecified    Anxiety    Endometriosis dx Jan 2014   Erythema nodosum    associated with menses; improved with OCP   Essential hypertension, benign    Family history of adverse reaction to anesthesia    mother has N&V   H/O pilonidal cyst    Headache, migraine    rare   Obesity, unspecified    Social History   Socioeconomic History   Marital status: Married    Spouse name: Ronaldo Miyamoto Klus   Number of children: 0   Years of education: college   Highest education level: Not on file  Occupational History   Occupation: Engineer, civil (consulting): [employer]    Comment: [employer]  Tobacco Use   Smoking status: Never   Smokeless tobacco: Never  Vaping Use   Vaping Use: Never used  Substance and Sexual Activity   Alcohol use: Yes    Alcohol/week: 1.0 standard drink    Types: 1 Cans of beer per week    Comment: rarely    Drug use: No   Sexual activity: Yes    Partners: Male    Birth control/protection: I.U.D.  Other Topics Concern   Not on file  Social History Narrative   Lives with her husband (married 05/04/2016) in the house she purchased  03/27/2014.   Her parents live nearby.   Her brother, sister-in-law and nephew live nearby.   Social Determinants of Health   Financial Resource Strain: Not on file  Food Insecurity: Not on file  Transportation Needs: Not on file  Physical Activity: Not on file  Stress: Not on file  Social Connections: Not on file  Intimate Partner Violence: Not on file   Family History  Problem Relation Age of Onset   Heart disease Father 23       AMI; stents   Hypertension Father    Hyperlipidemia Father    Diabetes Maternal Grandmother    Endometriosis Maternal Grandmother    Cancer Mother        kidney   Endometriosis Mother    Cancer Maternal Grandfather    Stroke Paternal Grandfather    Hypertension Paternal Grandfather    Hyperlipidemia Paternal Grandfather    Heart disease Paternal Glynda Jaeger, MD 07/07/21 1004

## 2021-07-12 ENCOUNTER — Other Ambulatory Visit: Payer: Self-pay

## 2021-07-12 DIAGNOSIS — I10 Essential (primary) hypertension: Secondary | ICD-10-CM | POA: Insufficient documentation

## 2021-07-12 DIAGNOSIS — Z79899 Other long term (current) drug therapy: Secondary | ICD-10-CM | POA: Diagnosis not present

## 2021-07-12 DIAGNOSIS — J029 Acute pharyngitis, unspecified: Secondary | ICD-10-CM | POA: Insufficient documentation

## 2021-07-12 DIAGNOSIS — R1310 Dysphagia, unspecified: Secondary | ICD-10-CM | POA: Diagnosis present

## 2021-07-12 DIAGNOSIS — R102 Pelvic and perineal pain: Secondary | ICD-10-CM | POA: Diagnosis not present

## 2021-07-13 ENCOUNTER — Other Ambulatory Visit: Payer: Self-pay

## 2021-07-13 ENCOUNTER — Emergency Department (HOSPITAL_COMMUNITY)
Admission: EM | Admit: 2021-07-13 | Discharge: 2021-07-13 | Disposition: A | Discharge status: Home / Self Care | Payer: 59 | Attending: Emergency Medicine | Admitting: Emergency Medicine

## 2021-07-13 ENCOUNTER — Encounter (HOSPITAL_COMMUNITY): Payer: Self-pay | Admitting: Radiology

## 2021-07-13 ENCOUNTER — Emergency Department (HOSPITAL_COMMUNITY): Payer: 59

## 2021-07-13 DIAGNOSIS — J029 Acute pharyngitis, unspecified: Secondary | ICD-10-CM

## 2021-07-13 LAB — BASIC METABOLIC PANEL
Anion gap: 10 (ref 5–15)
BUN: 10 mg/dL (ref 6–20)
CO2: 22 mmol/L (ref 22–32)
Calcium: 9.8 mg/dL (ref 8.9–10.3)
Chloride: 104 mmol/L (ref 98–111)
Creatinine, Ser: 0.77 mg/dL (ref 0.44–1.00)
GFR, Estimated: >60 mL/min (ref >60)
Glucose, Bld: 122 mg/dL — ABNORMAL HIGH (ref 70–99)
Potassium: 3.4 mmol/L — ABNORMAL LOW (ref 3.5–5.1)
Sodium: 136 mmol/L (ref 135–145)

## 2021-07-13 LAB — CBC WITH DIFFERENTIAL/PLATELET
Abs Immature Granulocytes: 0.03 10*3/uL (ref 0.00–0.07)
Basophils Absolute: 0.1 10*3/uL (ref 0.0–0.1)
Basophils Relative: 0 %
Eosinophils Absolute: 0.1 10*3/uL (ref 0.0–0.5)
Eosinophils Relative: 1 %
HCT: 37.7 % (ref 36.0–46.0)
Hemoglobin: 12.9 g/dL (ref 12.0–15.0)
Immature Granulocytes: 0 %
Lymphocytes Relative: 13 %
Lymphs Abs: 1.5 10*3/uL (ref 0.7–4.0)
MCH: 29.9 pg (ref 26.0–34.0)
MCHC: 34.2 g/dL (ref 30.0–36.0)
MCV: 87.3 fL (ref 80.0–100.0)
Monocytes Absolute: 0.7 10*3/uL (ref 0.1–1.0)
Monocytes Relative: 6 %
Neutro Abs: 9 10*3/uL — ABNORMAL HIGH (ref 1.7–7.7)
Neutrophils Relative %: 80 %
Platelets: 424 10*3/uL — ABNORMAL HIGH (ref 150–400)
RBC: 4.32 MIL/uL (ref 3.87–5.11)
RDW: 12.7 % (ref 11.5–15.5)
WBC: 11.3 10*3/uL — ABNORMAL HIGH (ref 4.0–10.5)
nRBC: 0 % (ref 0.0–0.2)

## 2021-07-13 LAB — I-STAT BETA HCG BLOOD, ED (MC, WL, AP ONLY): I-stat hCG, quantitative: <5 m[IU]/mL (ref <5)

## 2021-07-13 LAB — GROUP A STREP BY PCR: Group A Strep by PCR: NOT DETECTED

## 2021-07-13 MED ORDER — LIDOCAINE VISCOUS HCL 2 % MT SOLN
15.0000 mL | Freq: Once | OROMUCOSAL | Status: AC
  Administered 2021-07-13: 09:00:00 15 mL via OROMUCOSAL
  Filled 2021-07-13: qty 15

## 2021-07-13 MED ORDER — KETOROLAC TROMETHAMINE 15 MG/ML IJ SOLN
15.0000 mg | Freq: Once | INTRAMUSCULAR | Status: AC
  Administered 2021-07-13: 09:00:00 15 mg via INTRAVENOUS
  Filled 2021-07-13: qty 1

## 2021-07-13 MED ORDER — CEPHALEXIN 500 MG PO CAPS: 500.0000 mg | ORAL_CAPSULE | Freq: Two times a day (BID) | ORAL | 0 refills | Status: AC

## 2021-07-13 MED ORDER — IOHEXOL 300 MG/ML  SOLN
75.0000 mL | Freq: Once | INTRAMUSCULAR | Status: AC | PRN
  Administered 2021-07-13: 06:00:00 75 mL via INTRAVENOUS

## 2021-07-13 MED ORDER — SODIUM CHLORIDE 0.9 % IV BOLUS
500.0000 mL | Freq: Once | INTRAVENOUS | Status: AC
  Administered 2021-07-13: 09:00:00 500 mL via INTRAVENOUS

## 2021-07-13 MED ORDER — DEXAMETHASONE SODIUM PHOSPHATE 10 MG/ML IJ SOLN
10.0000 mg | Freq: Once | INTRAMUSCULAR | Status: AC
  Administered 2021-07-13: 09:00:00 10 mg via INTRAVENOUS
  Filled 2021-07-13: qty 1

## 2021-07-13 NOTE — ED Provider Notes (Signed)
Emergency Medicine Provider Triage Evaluation Note  Kimberly Glass , a 35 y.o. female  was evaluated in triage.  Pt complains of unilateral neck swelling onset 1 week ago.  Patient reports her father recently died from esophageal cancer and she is worried about this.  Reports it is difficult to swallow and uncomfortable.  Some voice change.  Reports she is seeing GI who has been treating for GERD.  She reports that the swelling has increased significantly over the last week.  Review of Systems  Positive: Sore throat, neck swelling, tonsillar swelling Negative: N/V, fevers  Physical Exam  BP (!) 147/86    Pulse 99    Temp 98.7 F (37.1 C) (Oral)    Resp 18    Ht 5\' 9"  (1.753 m)    Wt 124.7 kg    SpO2 96%    BMI 40.61 kg/m   Gen:   Awake, no distress   Resp:  Normal effort  MSK:   Moves extremities without difficulty  Other:  Swelling of the right tonsil and   Medical Decision Making  Medically screening exam initiated at 1:19 AM.  Appropriate orders placed.  Grasiela E Luthi was informed that the remainder of the evaluation will be completed by another provider, this initial triage assessment does not replace that evaluation, and the importance of remaining in the ED until their evaluation is complete.  Soft tissue neck swelling; CT ordered.    Auriana Scalia, 07/13/21 0121    07/15/21, MD 07/13/21 [phone]

## 2021-07-13 NOTE — ED Triage Notes (Signed)
Pt c/o difficulty swallowing since Friday. Pt also c/o left side pain/ Pt reports a lot of burping.

## 2021-07-13 NOTE — ED Provider Notes (Signed)
[facility] EMERGENCY DEPARTMENT Provider Note   CSN: [id number] Arrival date & time: 07/12/21  2355     History Chief Complaint  Patient presents with   Dysphagia    Kimberly Glass is a 35 y.o. female.  HPI Patient presents with her husband assists with the history. She presents due to 1 week of sore throat, difficulty swallowing, speaking. She has history of GI discomfort since August of this year, has seen GI, started Protonix.  She was in her usual state of health, however, until about 1 week ago when she developed difficulty with swallowing, speaking.  When she was seen and evaluated at urgent care 3 days ago.  She notes that she had a negative COVID and flu test at that point.  With persistent difficulty swallowing, speaking, no fever she presents today for evaluation. No new chest pain, no new abdominal pain, no clearly bleeding or exacerbating factors beyond swallowing and speaking.    Past Medical History:  Diagnosis Date   Acne vulgaris    Allergic rhinitis, cause unspecified    Anxiety    Endometriosis dx Jan 2014   Erythema nodosum    associated with menses; improved with OCP   Essential hypertension, benign    Family history of adverse reaction to anesthesia    mother has N&V   H/O pilonidal cyst    Headache, migraine    rare   Obesity, unspecified     Patient Active Problem List   Diagnosis Date Noted   Renal cyst 10/03/2020   Encounter for induction of labor 04/27/2019   Bilateral hip pain 03/12/2017   Hyperlipidemia 12/31/2016   Pilonidal disease 01/11/2014   Anxiety 06/22/2013   BMI 40.0-44.9, adult (HCC) 06/22/2013   Migraine 05/10/2013   Endometriosis    Irregular menses 07/30/2012   Dysmenorrhea 07/30/2012   Female pelvic pain 07/30/2012   Vulvar lesion 07/30/2012   Essential hypertension, benign     Past Surgical History:  Procedure Laterality Date   LAPAROSCOPIC NEPHRECTOMY Right 10/03/2020   Procedure:  LAPAROSCOPIC RENAL CYST DECORTICATION;  Surgeon: Rene Paci, MD;  Location: [facility];  Service: Urology;  Laterality: Right;  ONLY NEEDS 120 MIN FOR PROCEDURE   OPEN REDUCTION INTERNAL FIXATION (ORIF) METACARPAL Right 10/28/2014   Procedure: OPEN REDUCTION INTERNAL FIXATION RIGHT FINGER METACARPAL FRACTURE;  Surgeon: Dairl Ponder, MD;  Location: [facility];  Service: Orthopedics;  Laterality: Right;   ORIF RADIAL FRACTURE Right 10/28/2014   Procedure: OPEN REDUCTION INTERNAL FIXATION RIGHT RADIUS FRACTURE;  Surgeon: Dairl Ponder, MD;  Location: [facility];  Service: Orthopedics;  Laterality: Right;   ORIF SCAPHOID FRACTURE Right 10/28/2014   Procedure: OPEN REDUCTION INTERNAL FIXATION (ORIF) SCAPHOID FRACTURE;  Surgeon: Dairl Ponder, MD;  Location: [facility];  Service: Orthopedics;  Laterality: Right;   PILONIDAL CYST EXCISION  01/30/2015   Ardeth Sportsman, MD   WISDOM TOOTH EXTRACTION       OB History     Gravida  1   Para  1   Term  1   Preterm  0   AB  0   Living  1      SAB  0   IAB  0   Ectopic  0   Multiple  0   Live Births  1           Family History  Problem Relation Age of Onset   Heart disease Father 74       AMI; stents  Hypertension Father    Hyperlipidemia Father    Diabetes Maternal Grandmother    Endometriosis Maternal Grandmother    Cancer Mother        kidney   Endometriosis Mother    Cancer Maternal Grandfather    Stroke Paternal Grandfather    Hypertension Paternal Grandfather    Hyperlipidemia Paternal Grandfather    Heart disease Paternal Grandfather     Social History   Tobacco Use   Smoking status: Never   Smokeless tobacco: Never  Vaping Use   Vaping Use: Never used  Substance Use Topics   Alcohol use: Yes    Alcohol/week: 1.0 standard drink    Types: 1 Cans of beer per week    Comment: rarely    Drug use: No    Home Medications Prior to Admission medications   Medication Sig Start Date End Date Taking?  Authorizing Provider  ibuprofen (ADVIL) 200 MG tablet Take 400 mg by mouth every 6 (six) hours as needed for mild pain.   Yes [provider]  labetalol (NORMODYNE) 200 MG tablet Take 1 tablet (200 mg total) by mouth 2 (two) times daily. 04/29/19  Yes Morris, Megan, DO  levonorgestrel (MIRENA, 52 MG,) 20 MCG/DAY IUD 1 Intra Uterine Device by Intrauterine route once.   Yes [provider]  Multiple Vitamins-Minerals (MULTIVITAMIN PO) Take 1 tablet by mouth daily.   Yes [provider]  pantoprazole (PROTONIX) 20 MG tablet Take 20 mg by mouth daily. 05/16/21  Yes [provider]  venlafaxine XR (EFFEXOR-XR) 37.5 MG 24 hr capsule Take 37.5 mg by mouth daily. 07/05/21  Yes [provider]    Allergies    Diphenhydramine, Topiramate, Yasmin [drospirenone-ethinyl estradiol], and Penicillins  Review of Systems   Review of Systems  Constitutional:        Per HPI, otherwise negative  HENT:         Per HPI, otherwise negative  Respiratory:         Per HPI, otherwise negative  Cardiovascular:        Per HPI, otherwise negative  Gastrointestinal:  Negative for vomiting.  Endocrine:       Negative aside from HPI  Genitourinary:        Patient is breast-feeding, she has a 63-year-old child  Musculoskeletal:        Per HPI, otherwise negative  Skin: Negative.   Neurological:  Negative for syncope.   Physical Exam Updated Vital Signs BP 136/87    Pulse 66    Temp 98.7 F (37.1 C) (Oral)    Resp 19    Ht 5\' 9"  (1.753 m)    Wt 124.7 kg    SpO2 97%    BMI 40.61 kg/m   Physical Exam Vitals and nursing note reviewed.  Constitutional:      General: She is not in acute distress.    Appearance: She is well-developed.  HENT:     Head: Normocephalic and atraumatic.     Mouth/Throat:     Mouth: Mucous membranes are moist.     Pharynx: Posterior oropharyngeal erythema present.  Eyes:     Conjunctiva/sclera: Conjunctivae normal.  Cardiovascular:      Rate and Rhythm: Normal rate and regular rhythm.  Pulmonary:     Effort: Pulmonary effort is normal. No respiratory distress.     Breath sounds: Normal breath sounds. No stridor.  Abdominal:     General: There is no distension.  Musculoskeletal:     Cervical  back: No rigidity or tenderness.  Lymphadenopathy:     Cervical: No cervical adenopathy.  Skin:    General: Skin is warm and dry.  Neurological:     Mental Status: She is alert and oriented to person, place, and time.     Cranial Nerves: No cranial nerve deficit.    ED Results / Procedures / Treatments   Labs (all labs ordered are listed, but only abnormal results are displayed) Labs Reviewed  CBC WITH DIFFERENTIAL/PLATELET - Abnormal; Notable for the following components:      Result Value   WBC 11.3 (*)    Platelets 424 (*)    Neutro Abs 9.0 (*)    All other components within normal limits  BASIC METABOLIC PANEL - Abnormal; Notable for the following components:   Potassium 3.4 (*)    Glucose, Bld 122 (*)    All other components within normal limits  GROUP A STREP BY PCR  I-STAT BETA HCG BLOOD, ED ([facility], [facility], [facility] ONLY)    EKG None  Radiology CT Soft Tissue Neck W Contrast  Result Date: 07/13/2021 CLINICAL DATA:  35 year old female with difficulty swallowing for 1 week. Left side pain. Family history of esophageal cancer. EXAM: CT NECK WITH CONTRAST TECHNIQUE: Multidetector CT imaging of the neck was performed using the standard protocol following the bolus administration of intravenous contrast. CONTRAST:  75mL OMNIPAQUE IOHEXOL 300 MG/ML  SOLN COMPARISON:  Cervical spine CT 10/29/2014. FINDINGS: Pharynx and larynx: Laryngeal and pharyngeal soft tissue contours are within normal limits. Negative parapharyngeal and retropharyngeal spaces. Unremarkable cervical esophagus. Salivary glands: Negative. Thyroid: Oval 12 mm hypodense left thyroid nodule. Punctate hypodensity in the right thyroid lobe. No follow-up imaging is  recommended. Reference: J Am Coll Radiol. 2015 Feb;12(2): 143-50 Otherwise negative thyroid. Lymph nodes: Bilateral cervical lymph nodes are fairly symmetric and within normal limits. No lymphadenopathy. No heterogeneous nodes. Vascular: Major vascular structures in the neck and at the skull base are patent. No atherosclerosis identified. Right vertebral artery appears slightly dominant. Limited intracranial: Negative. Visualized orbits: Minimally included. Mastoids and visualized paranasal sinuses: Visualized paranasal sinuses and mastoids are clear. Skeleton: Negative. Upper chest: Negative lung apices. Normal visible superior mediastinum. Normal visible thoracic esophagus. IMPRESSION: Normal CT appearance of the Neck. Cervical and visible upper thoracic esophagus appear normal. Electronically Signed   By: Odessa Fleming M.D.   On: 07/13/2021 05:59    Procedures Procedures   Medications Ordered in ED Medications  iohexol (OMNIPAQUE) 300 MG/ML solution 75 mL (75 mLs Intravenous Contrast Given 07/13/21 0531)  ketorolac (TORADOL) 15 MG/ML injection 15 mg (15 mg Intravenous Given 07/13/21 0927)  dexamethasone (DECADRON) injection 10 mg (10 mg Intravenous Given 07/13/21 0928)  sodium chloride 0.9 % bolus 500 mL (0 mLs Intravenous Stopped 07/13/21 1046)  lidocaine (XYLOCAINE) 2 % viscous mouth solution 15 mL (15 mLs Mouth/Throat Given 07/13/21 1610)    ED Course  I have reviewed the triage vital signs and the nursing notes.  Pertinent labs & imaging results that were available during my care of the patient were reviewed by me and considered in my medical decision making (see chart for details).   11:20 AM Patient awake, alert, in no distress, speaking clearly.  Vital signs unremarkable.  Strep test negative, CT reviewed, discussed, patient is aware of thyroid nodule, need for outpatient testing.  With her history of intermittent dyspepsia, she was encouraged to follow-up with GI, but today she has a soft,  nonperitoneal abdomen, has largely pharyngitis as notable  physical exam findings, but no evidence for deep space infection, no indication for admission.  Patient started on antibiotics given her leukocytosis, pharyngitis, discharged in stable condition to follow-up with primary care and GI.  Patient has a penicillin allergy but tolerated Keflex earlier this year. MDM Rules/Calculators/A&P MDM Number of Diagnoses or Management Options Pharyngitis, unspecified etiology: new, needed workup   Amount and/or Complexity of Data Reviewed Clinical lab tests: ordered and reviewed Tests in the radiology section of CPT: ordered and reviewed Tests in the medicine section of CPT: reviewed and ordered Decide to obtain previous medical records or to obtain history from someone other than the patient: yes Obtain history from someone other than the patient: yes Review and summarize past medical records: yes Independent visualization of images, tracings, or specimens: yes  Risk of Complications, Morbidity, and/or Mortality Presenting problems: high Diagnostic procedures: high Management options: high  Critical Care Total time providing critical care: < 30 minutes  Patient Progress Patient progress: stable    Final Clinical Impression(s) / ED Diagnoses Final diagnoses:  Pharyngitis, unspecified etiology    Rx / DC Orders ED Discharge Orders          Ordered    cephALEXin (KEFLEX) 500 MG capsule  2 times daily        07/13/21 1123             Gerhard Munch, MD 07/13/21 1125

## 2021-07-13 NOTE — Discharge Instructions (Signed)
As discussed, your evaluation today has been largely reassuring.  But, it is important that you monitor your condition carefully, and do not hesitate to return to the ED if you develop new, or concerning changes in your condition.  Otherwise, please follow-up with your physician for appropriate ongoing care.  It is important that you discuss your intermittent left-sided pain with your gastroenterologist and your thyroid nodule with your primary care physician for appropriate outpatient management.

## 2021-07-15 ENCOUNTER — Encounter (HOSPITAL_COMMUNITY): Payer: Self-pay | Admitting: *Deleted

## 2021-07-15 ENCOUNTER — Other Ambulatory Visit: Payer: Self-pay

## 2021-07-15 ENCOUNTER — Emergency Department (HOSPITAL_COMMUNITY)
Admission: EM | Admit: 2021-07-15 | Discharge: 2021-07-15 | Disposition: A | Discharge status: Home / Self Care | Payer: 59 | Attending: Emergency Medicine | Admitting: Emergency Medicine

## 2021-07-15 DIAGNOSIS — I10 Essential (primary) hypertension: Secondary | ICD-10-CM | POA: Insufficient documentation

## 2021-07-15 DIAGNOSIS — R1010 Upper abdominal pain, unspecified: Secondary | ICD-10-CM

## 2021-07-15 DIAGNOSIS — N9489 Other specified conditions associated with female genital organs and menstrual cycle: Secondary | ICD-10-CM | POA: Diagnosis not present

## 2021-07-15 DIAGNOSIS — Z79899 Other long term (current) drug therapy: Secondary | ICD-10-CM | POA: Insufficient documentation

## 2021-07-15 DIAGNOSIS — R1012 Left upper quadrant pain: Secondary | ICD-10-CM | POA: Diagnosis not present

## 2021-07-15 LAB — URINALYSIS, ROUTINE W REFLEX MICROSCOPIC
Bilirubin Urine: NEGATIVE
Glucose, UA: NEGATIVE mg/dL
Ketones, ur: >80 mg/dL — AB
Leukocytes,Ua: NEGATIVE
Nitrite: NEGATIVE
Protein, ur: NEGATIVE mg/dL
Specific Gravity, Urine: >1.03 — ABNORMAL HIGH (ref 1.005–1.030)
pH: 6 (ref 5.0–8.0)

## 2021-07-15 LAB — CBC WITH DIFFERENTIAL/PLATELET
Abs Immature Granulocytes: 0.02 10*3/uL (ref 0.00–0.07)
Basophils Absolute: 0.1 10*3/uL (ref 0.0–0.1)
Basophils Relative: 1 %
Eosinophils Absolute: 0 10*3/uL (ref 0.0–0.5)
Eosinophils Relative: 1 %
HCT: 37.7 % (ref 36.0–46.0)
Hemoglobin: 12.9 g/dL (ref 12.0–15.0)
Immature Granulocytes: 0 %
Lymphocytes Relative: 15 %
Lymphs Abs: 1.3 10*3/uL (ref 0.7–4.0)
MCH: 29.8 pg (ref 26.0–34.0)
MCHC: 34.2 g/dL (ref 30.0–36.0)
MCV: 87.1 fL (ref 80.0–100.0)
Monocytes Absolute: 0.6 10*3/uL (ref 0.1–1.0)
Monocytes Relative: 7 %
Neutro Abs: 6.5 10*3/uL (ref 1.7–7.7)
Neutrophils Relative %: 76 %
Platelets: 390 10*3/uL (ref 150–400)
RBC: 4.33 MIL/uL (ref 3.87–5.11)
RDW: 13 % (ref 11.5–15.5)
WBC: 8.4 10*3/uL (ref 4.0–10.5)
nRBC: 0 % (ref 0.0–0.2)

## 2021-07-15 LAB — HCG, QUANTITATIVE, PREGNANCY: hCG, Beta Chain, Quant, S: <1 m[IU]/mL (ref <5)

## 2021-07-15 LAB — LIPASE, BLOOD: Lipase: 39 U/L (ref 11–51)

## 2021-07-15 LAB — URINALYSIS, MICROSCOPIC (REFLEX)

## 2021-07-15 LAB — COMPREHENSIVE METABOLIC PANEL
ALT: 31 U/L (ref 0–44)
AST: 17 U/L (ref 15–41)
Albumin: 4.4 g/dL (ref 3.5–5.0)
Alkaline Phosphatase: 56 U/L (ref 38–126)
Anion gap: 14 (ref 5–15)
BUN: 13 mg/dL (ref 6–20)
CO2: 19 mmol/L — ABNORMAL LOW (ref 22–32)
Calcium: 9 mg/dL (ref 8.9–10.3)
Chloride: 106 mmol/L (ref 98–111)
Creatinine, Ser: 0.62 mg/dL (ref 0.44–1.00)
GFR, Estimated: >60 mL/min (ref >60)
Glucose, Bld: 114 mg/dL — ABNORMAL HIGH (ref 70–99)
Potassium: 3.1 mmol/L — ABNORMAL LOW (ref 3.5–5.1)
Sodium: 139 mmol/L (ref 135–145)
Total Bilirubin: 1.4 mg/dL — ABNORMAL HIGH (ref 0.3–1.2)
Total Protein: 7.1 g/dL (ref 6.5–8.1)

## 2021-07-15 NOTE — ED Provider Notes (Signed)
[facility] EMERGENCY DEPARTMENT Provider Note   CSN: [id number] Arrival date & time: 07/15/21  1159     History Chief Complaint  Patient presents with   Abdominal Pain    Kimberly Glass is a 35 y.o. female.  Patient complains of left upper quadrant pain that she has had for a number of months now no vomiting no diarrhea.  She seen GI and started on Protonix  The history is provided by the patient. No language interpreter was used.  Abdominal Pain Pain location:  RUQ Pain quality: aching   Pain radiates to:  Does not radiate Pain severity:  Moderate Onset quality:  Sudden Timing:  Constant Progression:  Waxing and waning Chronicity:  Recurrent Context: not alcohol use   Associated symptoms: no chest pain, no cough, no diarrhea, no fatigue and no hematuria       Past Medical History:  Diagnosis Date   Acne vulgaris    Allergic rhinitis, cause unspecified    Anxiety    Endometriosis dx Jan 2014   Erythema nodosum    associated with menses; improved with OCP   Essential hypertension, benign    Family history of adverse reaction to anesthesia    mother has N&V   H/O pilonidal cyst    Headache, migraine    rare   Obesity, unspecified     Patient Active Problem List   Diagnosis Date Noted   Renal cyst 10/03/2020   Encounter for induction of labor 04/27/2019   Bilateral hip pain 03/12/2017   Hyperlipidemia 12/31/2016   Pilonidal disease 01/11/2014   Anxiety 06/22/2013   BMI 40.0-44.9, adult (HCC) 06/22/2013   Migraine 05/10/2013   Endometriosis    Irregular menses 07/30/2012   Dysmenorrhea 07/30/2012   Female pelvic pain 07/30/2012   Vulvar lesion 07/30/2012   Essential hypertension, benign     Past Surgical History:  Procedure Laterality Date   LAPAROSCOPIC NEPHRECTOMY Right 10/03/2020   Procedure: LAPAROSCOPIC RENAL CYST DECORTICATION;  Surgeon: Rene Paci, MD;  Location: [facility];  Service: Urology;  Laterality: Right;  ONLY NEEDS 120  MIN FOR PROCEDURE   OPEN REDUCTION INTERNAL FIXATION (ORIF) METACARPAL Right 10/28/2014   Procedure: OPEN REDUCTION INTERNAL FIXATION RIGHT FINGER METACARPAL FRACTURE;  Surgeon: Dairl Ponder, MD;  Location: [facility];  Service: Orthopedics;  Laterality: Right;   ORIF RADIAL FRACTURE Right 10/28/2014   Procedure: OPEN REDUCTION INTERNAL FIXATION RIGHT RADIUS FRACTURE;  Surgeon: Dairl Ponder, MD;  Location: [facility];  Service: Orthopedics;  Laterality: Right;   ORIF SCAPHOID FRACTURE Right 10/28/2014   Procedure: OPEN REDUCTION INTERNAL FIXATION (ORIF) SCAPHOID FRACTURE;  Surgeon: Dairl Ponder, MD;  Location: [facility];  Service: Orthopedics;  Laterality: Right;   PILONIDAL CYST EXCISION  01/30/2015   Ardeth Sportsman, MD   WISDOM TOOTH EXTRACTION       OB History     Gravida  1   Para  1   Term  1   Preterm  0   AB  0   Living  1      SAB  0   IAB  0   Ectopic  0   Multiple  0   Live Births  1           Family History  Problem Relation Age of Onset   Heart disease Father 67       AMI; stents   Hypertension Father    Hyperlipidemia Father    Diabetes Maternal Grandmother  Endometriosis Maternal Grandmother    Cancer Mother        kidney   Endometriosis Mother    Cancer Maternal Grandfather    Stroke Paternal Grandfather    Hypertension Paternal Grandfather    Hyperlipidemia Paternal Grandfather    Heart disease Paternal Grandfather     Social History   Tobacco Use   Smoking status: Never   Smokeless tobacco: Never  Vaping Use   Vaping Use: Never used  Substance Use Topics   Alcohol use: Yes    Alcohol/week: 1.0 standard drink    Types: 1 Cans of beer per week    Comment: rarely    Drug use: No    Home Medications Prior to Admission medications   Medication Sig Start Date End Date Taking? Authorizing Provider  cephALEXin (KEFLEX) 500 MG capsule Take 1 capsule (500 mg total) by mouth 2 (two) times daily for 5 days. 07/13/21 07/18/21  Carmin Muskrat, MD  ibuprofen (ADVIL) 200 MG tablet Take 400 mg by mouth every 6 (six) hours as needed for mild pain.    [provider]  labetalol (NORMODYNE) 200 MG tablet Take 1 tablet (200 mg total) by mouth 2 (two) times daily. 04/29/19   Morris, Jinny Blossom, DO  levonorgestrel (MIRENA, 52 MG,) 20 MCG/DAY IUD 1 Intra Uterine Device by Intrauterine route once.    [provider]  Multiple Vitamins-Minerals (MULTIVITAMIN PO) Take 1 tablet by mouth daily.    [provider]  pantoprazole (PROTONIX) 20 MG tablet Take 20 mg by mouth daily. 05/16/21   [provider]  venlafaxine XR (EFFEXOR-XR) 37.5 MG 24 hr capsule Take 37.5 mg by mouth daily. 07/05/21   [provider]    Allergies    Diphenhydramine, Topiramate, Yasmin [drospirenone-ethinyl estradiol], and Penicillins  Review of Systems   Review of Systems  Constitutional:  Negative for appetite change and fatigue.  HENT:  Negative for congestion, ear discharge and sinus pressure.   Eyes:  Negative for discharge.  Respiratory:  Negative for cough.   Cardiovascular:  Negative for chest pain.  Gastrointestinal:  Positive for abdominal pain. Negative for diarrhea.  Genitourinary:  Negative for frequency and hematuria.  Musculoskeletal:  Negative for back pain.  Skin:  Negative for rash.  Neurological:  Negative for seizures and headaches.  Psychiatric/Behavioral:  Negative for hallucinations.    Physical Exam Updated Vital Signs BP (!) 149/88    Pulse 85    Temp 98.2 F (36.8 C)    Resp 18    Ht 5\' 9"  (1.753 m)    Wt 124.7 kg    SpO2 98%    BMI 40.61 kg/m   Physical Exam Vitals and nursing note reviewed.  Constitutional:      Appearance: She is well-developed.  HENT:     Head: Normocephalic.     Nose: Nose normal.  Eyes:     General: No scleral icterus.    Conjunctiva/sclera: Conjunctivae normal.  Neck:     Thyroid: No thyromegaly.  Cardiovascular:     Rate and Rhythm: Normal rate and regular  rhythm.     Heart sounds: No murmur heard.   No friction rub. No gallop.  Pulmonary:     Breath sounds: No stridor. No wheezing or rales.  Chest:     Chest wall: No tenderness.  Abdominal:     General: There is no distension.     Tenderness: There is abdominal tenderness. There is no rebound.     Comments:  Minimal left upper quadrant tenderness  Musculoskeletal:        General: Normal range of motion.     Cervical back: Neck supple.  Lymphadenopathy:     Cervical: No cervical adenopathy.  Skin:    Findings: No erythema or rash.  Neurological:     Mental Status: She is alert and oriented to person, place, and time.     Motor: No abnormal muscle tone.     Coordination: Coordination normal.  Psychiatric:        Behavior: Behavior normal.    ED Results / Procedures / Treatments   Labs (all labs ordered are listed, but only abnormal results are displayed) Labs Reviewed  COMPREHENSIVE METABOLIC PANEL - Abnormal; Notable for the following components:      Result Value   Potassium 3.1 (*)    CO2 19 (*)    Glucose, Bld 114 (*)    Total Bilirubin 1.4 (*)    All other components within normal limits  URINALYSIS, ROUTINE W REFLEX MICROSCOPIC - Abnormal; Notable for the following components:   Specific Gravity, Urine >1.030 (*)    Hgb urine dipstick TRACE (*)    Ketones, ur >80 (*)    All other components within normal limits  URINALYSIS, MICROSCOPIC (REFLEX) - Abnormal; Notable for the following components:   Bacteria, UA RARE (*)    All other components within normal limits  CBC WITH DIFFERENTIAL/PLATELET  LIPASE, BLOOD  HCG, QUANTITATIVE, PREGNANCY    EKG None  Radiology No results found.  Procedures Procedures   Medications Ordered in ED Medications - No data to display  ED Course  I have reviewed the triage vital signs and the nursing notes.  Pertinent labs & imaging results that were available during my care of the patient were reviewed by me and considered  in my medical decision making (see chart for details).    MDM Rules/Calculators/A&P                         Patient with abdominal pain and unremarkable labs.  She is referred to GI    Final Clinical Impression(s) / ED Diagnoses Final diagnoses:  Pain of upper abdomen    Rx / DC Orders ED Discharge Orders     None        Milton Ferguson, MD 07/15/21 1407

## 2021-07-15 NOTE — ED Triage Notes (Signed)
Left lower quadrant pain for months

## 2021-07-15 NOTE — Discharge Instructions (Signed)
Follow-up with Dr. Jena Gauss or one of his colleagues this week.  Make sure you tell them you were in the emergency department.  Call them on Tuesday.  I would take Tylenol for the discomfort and if necessary you can take 800 mg of Motrin 3 times a day.

## 2021-07-17 ENCOUNTER — Emergency Department (HOSPITAL_COMMUNITY): Payer: 59

## 2021-07-17 ENCOUNTER — Emergency Department (HOSPITAL_COMMUNITY)
Admission: EM | Admit: 2021-07-17 | Discharge: 2021-07-17 | Disposition: A | Discharge status: Home / Self Care | Payer: 59 | Attending: Emergency Medicine | Admitting: Emergency Medicine

## 2021-07-17 ENCOUNTER — Encounter (HOSPITAL_COMMUNITY): Payer: Self-pay

## 2021-07-17 ENCOUNTER — Other Ambulatory Visit: Payer: Self-pay

## 2021-07-17 DIAGNOSIS — R1012 Left upper quadrant pain: Secondary | ICD-10-CM | POA: Diagnosis present

## 2021-07-17 DIAGNOSIS — E876 Hypokalemia: Secondary | ICD-10-CM | POA: Diagnosis not present

## 2021-07-17 DIAGNOSIS — I10 Essential (primary) hypertension: Secondary | ICD-10-CM | POA: Insufficient documentation

## 2021-07-17 DIAGNOSIS — Z79899 Other long term (current) drug therapy: Secondary | ICD-10-CM | POA: Diagnosis not present

## 2021-07-17 DIAGNOSIS — K8020 Calculus of gallbladder without cholecystitis without obstruction: Secondary | ICD-10-CM | POA: Diagnosis not present

## 2021-07-17 DIAGNOSIS — N9489 Other specified conditions associated with female genital organs and menstrual cycle: Secondary | ICD-10-CM | POA: Insufficient documentation

## 2021-07-17 LAB — CBC WITH DIFFERENTIAL/PLATELET
Abs Immature Granulocytes: 0.03 10*3/uL (ref 0.00–0.07)
Basophils Absolute: 0.1 10*3/uL (ref 0.0–0.1)
Basophils Relative: 1 %
Eosinophils Absolute: 0.1 10*3/uL (ref 0.0–0.5)
Eosinophils Relative: 1 %
HCT: 38.9 % (ref 36.0–46.0)
Hemoglobin: 13.1 g/dL (ref 12.0–15.0)
Immature Granulocytes: 0 %
Lymphocytes Relative: 23 %
Lymphs Abs: 1.9 10*3/uL (ref 0.7–4.0)
MCH: 29.4 pg (ref 26.0–34.0)
MCHC: 33.7 g/dL (ref 30.0–36.0)
MCV: 87.2 fL (ref 80.0–100.0)
Monocytes Absolute: 0.6 10*3/uL (ref 0.1–1.0)
Monocytes Relative: 8 %
Neutro Abs: 5.6 10*3/uL (ref 1.7–7.7)
Neutrophils Relative %: 67 %
Platelets: 400 10*3/uL (ref 150–400)
RBC: 4.46 MIL/uL (ref 3.87–5.11)
RDW: 12.8 % (ref 11.5–15.5)
WBC: 8.3 10*3/uL (ref 4.0–10.5)
nRBC: 0 % (ref 0.0–0.2)

## 2021-07-17 LAB — COMPREHENSIVE METABOLIC PANEL
ALT: 30 U/L (ref 0–44)
AST: 16 U/L (ref 15–41)
Albumin: 4.5 g/dL (ref 3.5–5.0)
Alkaline Phosphatase: 58 U/L (ref 38–126)
Anion gap: 13 (ref 5–15)
BUN: 8 mg/dL (ref 6–20)
CO2: 19 mmol/L — ABNORMAL LOW (ref 22–32)
Calcium: 8.8 mg/dL — ABNORMAL LOW (ref 8.9–10.3)
Chloride: 102 mmol/L (ref 98–111)
Creatinine, Ser: 0.5 mg/dL (ref 0.44–1.00)
GFR, Estimated: >60 mL/min (ref >60)
Glucose, Bld: 88 mg/dL (ref 70–99)
Potassium: 3.1 mmol/L — ABNORMAL LOW (ref 3.5–5.1)
Sodium: 134 mmol/L — ABNORMAL LOW (ref 135–145)
Total Bilirubin: 1.6 mg/dL — ABNORMAL HIGH (ref 0.3–1.2)
Total Protein: 7.2 g/dL (ref 6.5–8.1)

## 2021-07-17 LAB — HCG, QUANTITATIVE, PREGNANCY: hCG, Beta Chain, Quant, S: <1 m[IU]/mL (ref <5)

## 2021-07-17 LAB — LIPASE, BLOOD: Lipase: 37 U/L (ref 11–51)

## 2021-07-17 MED ORDER — SODIUM CHLORIDE 0.9 % IV BOLUS
1000.0000 mL | Freq: Once | INTRAVENOUS | Status: AC
  Administered 2021-07-17: 19:00:00 1000 mL via INTRAVENOUS

## 2021-07-17 MED ORDER — POTASSIUM CHLORIDE CRYS ER 20 MEQ PO TBCR: 20.0000 meq | EXTENDED_RELEASE_TABLET | Freq: Two times a day (BID) | ORAL | 0 refills | Status: AC

## 2021-07-17 MED ORDER — IOHEXOL 300 MG/ML  SOLN
100.0000 mL | Freq: Once | INTRAMUSCULAR | Status: AC | PRN
  Administered 2021-07-17: 20:00:00 100 mL via INTRAVENOUS

## 2021-07-17 NOTE — ED Triage Notes (Signed)
Patient complaining of flank pain unable to eat for a couple months.

## 2021-07-17 NOTE — ED Provider Notes (Signed)
°  Patient signed out to me by Burgess Amor, PA-C at end of shift with pending CT of the abdomen and pelvis.  Patient here with left upper quadrant pain triggered by food intake.  This is a recurrent problem for her.  She has a gastroenterology appointment scheduled for January 4.  Patient had a ultrasound of the abdomen this evening that showed cholelithiasis without evidence of acute cholecystitis.  A CT of the abdomen pelvis was ordered to further evaluate her left upper quadrant pain.  CT scan of the abdomen and pelvis was unremarkable for acute process. Gust findings with the patient.  Offered pain medication but she declined stating that she is breast-feeding and prefers to use over-the-counter medications.  She is taking pantoprazole 20 mg once daily, I have recommended that she increase this to 1 tablet twice daily until she sees her gastroenterologist.  She is agreeable to plan.  Appears appropriate for discharge home, all questions answered.      CT ABDOMEN PELVIS W CONTRAST  Result Date: 07/17/2021 CLINICAL DATA:  LEFT upper quadrant abdominal pain. Nausea and diarrhea. EXAM: CT ABDOMEN AND PELVIS WITH CONTRAST TECHNIQUE: Multidetector CT imaging of the abdomen and pelvis was performed using the standard protocol following bolus administration of intravenous contrast. CONTRAST:  OMNIPAQUE IOHEXOL 300 MG/ML  SOLN COMPARISON:  CT abdomen dated 11/10/2020. FINDINGS: Lower chest: No acute abnormality. Hepatobiliary: No acute or suspicious finding within the liver. Gallbladder is unremarkable. No bile duct dilatation. Pancreas: Unremarkable. No pancreatic ductal dilatation or surrounding inflammatory changes. Spleen: Normal in size without focal abnormality. Adrenals/Urinary Tract: Adrenal glands appear normal. LEFT kidney is unremarkable without mass, stone or hydronephrosis. Postsurgical changes within the lateral cortex of the RIGHT kidney, related to a previous cyst decortication. No  hydronephrosis. No perinephric inflammation. No ureteral or bladder calculi. Bladder is unremarkable, partially decompressed. Stomach/Bowel: No dilated large or small bowel loops. No evidence of bowel wall inflammation. Appendix is normal. Stomach is unremarkable, partially decompressed. Vascular/Lymphatic: No significant vascular findings are present. No enlarged abdominal or pelvic lymph nodes. Reproductive: Intrauterine device appears appropriately positioned. No adnexal mass or free fluid. Other: No free fluid or abscess collection. No free intraperitoneal air. Musculoskeletal: No acute-appearing osseous abnormality. Mild scoliosis of the thoracolumbar spine. IMPRESSION: 1. No acute findings within the abdomen or pelvis. No bowel obstruction or evidence of bowel wall inflammation. No evidence of acute solid organ abnormality. No renal or ureteral calculi. Appendix is normal. 2. Chronic/incidental findings detailed above. Electronically Signed   By: Bary Richard M.D.   On: 07/17/2021 19:57       Pauline Aus, PA-C 07/17/21 2126    Benjiman Core, MD 07/18/21 [phone]

## 2021-07-17 NOTE — Discharge Instructions (Signed)
Your CT scan of your abdomen and pelvis this evening was reassuring.  As discussed, you have some gallstones within your gallbladder.  It is unclear if this is the source of your pain.  I recommend that you keep your appointment with your gastroenterologist for next week.  You may increase your pantoprazole to one 20 mg tablet twice daily until you see your gastroenterologist.  Return emergency department for any new or worsening symptoms.

## 2021-07-17 NOTE — ED Provider Notes (Signed)
[facility] EMERGENCY DEPARTMENT Provider Note   CSN: [id number] Arrival date & time: 07/17/21  1316     History Chief Complaint  Patient presents with   Flank Pain    Kimberly Glass is a 35 y.o. female presenting with acute on chronic left upper quadrant pain which is getting progressively worse since October.  She was seen by a GI specialist with [facility] in October and placed on pantoprazole which she takes daily for presumptive acid reflux disease but her symptoms have not improved.  She describes a sharp stabbing pain, sometimes with reflux like symptoms as well which is triggered by eating any food.  She she can tolerate consuming liquids which do not trigger the symptoms.  She endorses nausea without emesis with her pain.  She has had no fevers or chills.  She also reports recurrent diarrhea, but is currently for finishing a 5-day course of Keflex for pharyngitis after being seen at [facility] for this complaint on December 23 at which point she had complaints of difficulty swallowing and speaking, a strep test was negative, she also had a CT soft tissue neck which was negative for any acute findings.  She has recently had COVID and flu testing which were also negative.  She states her diarrhea started shortly after starting.  She was seen here for her abdominal pain 2 days ago at which time her labs are reassuring, although she did have a hypokalemia at 3.1.  She is not currently pregnant but she is breast-feeding.    The history is provided by the patient.      Past Medical History:  Diagnosis Date   Acne vulgaris    Allergic rhinitis, cause unspecified    Anxiety    Endometriosis dx Jan 2014   Erythema nodosum    associated with menses; improved with OCP   Essential hypertension, benign    Family history of adverse reaction to anesthesia    mother has N&V   H/O pilonidal cyst    Headache, migraine    rare   Obesity, unspecified     Patient Active Problem List    Diagnosis Date Noted   Renal cyst 10/03/2020   Encounter for induction of labor 04/27/2019   Bilateral hip pain 03/12/2017   Hyperlipidemia 12/31/2016   Pilonidal disease 01/11/2014   Anxiety 06/22/2013   BMI 40.0-44.9, adult (HCC) 06/22/2013   Migraine 05/10/2013   Endometriosis    Irregular menses 07/30/2012   Dysmenorrhea 07/30/2012   Female pelvic pain 07/30/2012   Vulvar lesion 07/30/2012   Essential hypertension, benign     Past Surgical History:  Procedure Laterality Date   LAPAROSCOPIC NEPHRECTOMY Right 10/03/2020   Procedure: LAPAROSCOPIC RENAL CYST DECORTICATION;  Surgeon: Rene Paci, MD;  Location: [facility];  Service: Urology;  Laterality: Right;  ONLY NEEDS 120 MIN FOR PROCEDURE   OPEN REDUCTION INTERNAL FIXATION (ORIF) METACARPAL Right 10/28/2014   Procedure: OPEN REDUCTION INTERNAL FIXATION RIGHT FINGER METACARPAL FRACTURE;  Surgeon: Dairl Ponder, MD;  Location: [facility];  Service: Orthopedics;  Laterality: Right;   ORIF RADIAL FRACTURE Right 10/28/2014   Procedure: OPEN REDUCTION INTERNAL FIXATION RIGHT RADIUS FRACTURE;  Surgeon: Dairl Ponder, MD;  Location: [facility];  Service: Orthopedics;  Laterality: Right;   ORIF SCAPHOID FRACTURE Right 10/28/2014   Procedure: OPEN REDUCTION INTERNAL FIXATION (ORIF) SCAPHOID FRACTURE;  Surgeon: Dairl Ponder, MD;  Location: [facility];  Service: Orthopedics;  Laterality: Right;   PILONIDAL CYST EXCISION  01/30/2015   Viviann Spare  C. Gross, MD   WISDOM TOOTH EXTRACTION       OB History     Gravida  1   Para  1   Term  1   Preterm  0   AB  0   Living  1      SAB  0   IAB  0   Ectopic  0   Multiple  0   Live Births  1           Family History  Problem Relation Age of Onset   Heart disease Father 39       AMI; stents   Hypertension Father    Hyperlipidemia Father    Diabetes Maternal Grandmother    Endometriosis Maternal Grandmother    Cancer Mother        kidney   Endometriosis Mother     Cancer Maternal Grandfather    Stroke Paternal Grandfather    Hypertension Paternal Grandfather    Hyperlipidemia Paternal Grandfather    Heart disease Paternal Grandfather     Social History   Tobacco Use   Smoking status: Never   Smokeless tobacco: Never  Vaping Use   Vaping Use: Never used  Substance Use Topics   Alcohol use: Yes    Alcohol/week: 1.0 standard drink    Types: 1 Cans of beer per week    Comment: rarely    Drug use: No    Home Medications Prior to Admission medications   Medication Sig Start Date End Date Taking? Authorizing Provider  cephALEXin (KEFLEX) 500 MG capsule Take 1 capsule (500 mg total) by mouth 2 (two) times daily for 5 days. 07/13/21 07/18/21 Yes Gerhard Munch, MD  ibuprofen (ADVIL) 200 MG tablet Take 400 mg by mouth every 6 (six) hours as needed for mild pain.   Yes [provider]  labetalol (NORMODYNE) 200 MG tablet Take 1 tablet (200 mg total) by mouth 2 (two) times daily. 04/29/19  Yes Morris, Megan, DO  levonorgestrel (MIRENA, 52 MG,) 20 MCG/DAY IUD 1 Intra Uterine Device by Intrauterine route once.   Yes [provider]  pantoprazole (PROTONIX) 20 MG tablet Take 20 mg by mouth daily. 05/16/21  Yes [provider]  potassium chloride SA (KLOR-CON M) 20 MEQ tablet Take 1 tablet (20 mEq total) by mouth 2 (two) times daily. 07/17/21  Yes Homer Pfeifer, Raynelle Fanning, PA-C  venlafaxine XR (EFFEXOR-XR) 37.5 MG 24 hr capsule Take 37.5 mg by mouth daily. 07/05/21  Yes [provider]    Allergies    Diphenhydramine, Topiramate, Yasmin [drospirenone-ethinyl estradiol], and Penicillins  Review of Systems   Review of Systems  Constitutional:  Negative for chills and fever.  HENT:  Negative for congestion.   Eyes: Negative.   Respiratory:  Negative for chest tightness and shortness of breath.   Cardiovascular:  Negative for chest pain.  Gastrointestinal:  Positive for abdominal pain, diarrhea and nausea. Negative for blood in  stool and vomiting.  Genitourinary: Negative.   Musculoskeletal:  Negative for arthralgias, joint swelling and neck pain.  Skin: Negative.  Negative for rash and wound.  Neurological:  Negative for dizziness, weakness, light-headedness, numbness and headaches.  Psychiatric/Behavioral: Negative.     Physical Exam Updated Vital Signs BP 130/81    Pulse 74    Temp 98.8 F (37.1 C) (Oral)    Resp 16    Ht 5' 9.5" (1.765 m)    Wt 122.5 kg    SpO2 99%    BMI  39.30 kg/m   Physical Exam Vitals and nursing note reviewed.  Constitutional:      Appearance: She is well-developed.  HENT:     Head: Normocephalic and atraumatic.  Eyes:     Conjunctiva/sclera: Conjunctivae normal.  Cardiovascular:     Rate and Rhythm: Normal rate and regular rhythm.     Heart sounds: Normal heart sounds.  Pulmonary:     Effort: Pulmonary effort is normal.     Breath sounds: Normal breath sounds. No wheezing.  Abdominal:     General: Bowel sounds are normal.     Palpations: Abdomen is soft.     Tenderness: There is abdominal tenderness in the left upper quadrant. There is no right CVA tenderness, left CVA tenderness or guarding.  Musculoskeletal:        General: Normal range of motion.     Cervical back: Normal range of motion.  Skin:    General: Skin is warm and dry.  Neurological:     Mental Status: She is alert.    ED Results / Procedures / Treatments   Labs (all labs ordered are listed, but only abnormal results are displayed) Labs Reviewed  COMPREHENSIVE METABOLIC PANEL - Abnormal; Notable for the following components:      Result Value   Sodium 134 (*)    Potassium 3.1 (*)    CO2 19 (*)    Calcium 8.8 (*)    Total Bilirubin 1.6 (*)    All other components within normal limits  CBC WITH DIFFERENTIAL/PLATELET  LIPASE, BLOOD  HCG, QUANTITATIVE, PREGNANCY    EKG None  Radiology US Abdomen Complete  Result Date: 07/17/2021 CLINICAL DATA:  Left upper quadrant pain with elevated liver  function studies for 2 days. EXAM: ABDOMEN ULTRASOUND COMPLETE COMPARISON:  CT 11/10/2020 FINDINGS: Gallbladder: Cholelithiasis with multiple stones measuring up to 1.7 cm diameter. No wall thickening or edema. Murphy's sign is negative. Common bile duct: Diameter: 2 mm, normal Liver: Homogeneous appearance of the liver parenchyma with mild increased echotexture suggesting fatty infiltration. No focal lesions. Portal vein is patent on color Doppler imaging with normal direction of blood flow towards the liver. IVC: No abnormality visualized. Pancreas: Visualized portion unremarkable. Spleen: Size and appearance within normal limits. Right Kidney: Length: 11.5 cm. Anterior midpole cyst measuring 2.5 cm maximal diameter. No solid mass or hydronephrosis. Left Kidney: Length: 11.5 cm. Echogenicity within normal limits. No mass or hydronephrosis visualized. Abdominal aorta: No aneurysm visualized. Other findings: None. IMPRESSION: 1. Cholelithiasis without additional evidence of acute cholecystitis. 2. Benign-appearing cyst in the right kidney. No follow-up is indicated. 3. Mild diffuse fatty infiltration of the liver. No follow-up is indicated. Electronically Signed   By: Burman Nieves M.D.   On: 07/17/2021 17:40    Procedures Procedures   Medications Ordered in ED Medications  sodium chloride 0.9 % bolus 1,000 mL (1,000 mLs Intravenous New Bag/Given 07/17/21 1849)    ED Course  I have reviewed the triage vital signs and the nursing notes.  Pertinent labs & imaging results that were available during my care of the patient were reviewed by me and considered in my medical decision making (see chart for details).    MDM Rules/Calculators/A&P                         Patient with persistently worsening left upper quadrant pain which is triggered by meals.  Ultrasound tonight does reflect cholelithiasis without evidence for acute cholecystitis.  She has  a normal caliber common bile duct.  Lab test tonight  are reassuring with normal LFTs and lipase.  Her bilirubin is slightly elevated at 1.6, was 1.42 days ago.  She also has a hypokalemia at 3.1.  She will be given oral supplementation for this.  CT ordered at this time to assess for other sources of left upper quadrant pain.  At reevaluation patient is tolerating her symptoms, she defers any pain or nausea medicines as she is breast-feeding at this time.  She reports that she has an appointment with her GI specialist in [facility] on January 4.  Patient pending CT at this time, discussed with Pauline Aus, PA-C who will dispo patient pending results of the CT scan.        Final Clinical Impression(s) / ED Diagnoses Final diagnoses:  Gallstones  Hypokalemia    Rx / DC Orders ED Discharge Orders          Ordered    potassium chloride SA (KLOR-CON M) 20 MEQ tablet  2 times daily        07/17/21 1854             Victoriano Lain 07/17/21 1854    Benjiman Core, MD 07/18/21 [phone]

## 2021-07-25 ENCOUNTER — Other Ambulatory Visit: Payer: Self-pay | Admitting: Otolaryngology

## 2021-07-25 DIAGNOSIS — E041 Nontoxic single thyroid nodule: Secondary | ICD-10-CM

## 2021-07-31 ENCOUNTER — Ambulatory Visit
Admission: RE | Admit: 2021-07-31 | Discharge: 2021-07-31 | Disposition: A | Discharge status: Home / Self Care | Payer: 59 | Source: Ambulatory Visit | Attending: Otolaryngology | Admitting: Otolaryngology

## 2021-07-31 DIAGNOSIS — E041 Nontoxic single thyroid nodule: Secondary | ICD-10-CM

## 2021-08-07 ENCOUNTER — Ambulatory Visit (INDEPENDENT_AMBULATORY_CARE_PROVIDER_SITE_OTHER): Payer: 59 | Admitting: Internal Medicine

## 2021-08-16 ENCOUNTER — Ambulatory Visit (INDEPENDENT_AMBULATORY_CARE_PROVIDER_SITE_OTHER): Payer: 59 | Admitting: Gastroenterology
